# Patient Record
Sex: Female | Born: 1965 | Race: White | Hispanic: No | Marital: Married | Smoking: Never smoker
Health system: Southern US, Community
[De-identification: ages and names within clinical notes are randomized; demographics above are authoritative.]

## PROBLEM LIST (undated history)

## (undated) DIAGNOSIS — N183 Chronic kidney disease, stage 3 unspecified: Secondary | ICD-10-CM

## (undated) DIAGNOSIS — M1990 Unspecified osteoarthritis, unspecified site: Secondary | ICD-10-CM

## (undated) DIAGNOSIS — I10 Essential (primary) hypertension: Secondary | ICD-10-CM

## (undated) HISTORY — DX: Unspecified osteoarthritis, unspecified site: M19.90

## (undated) HISTORY — DX: Essential (primary) hypertension: I10

## (undated) HISTORY — DX: Chronic kidney disease, stage 3 unspecified: N18.30

---

## 2000-08-30 ENCOUNTER — Encounter: Admission: RE | Admit: 2000-08-30 | Discharge: 2000-08-30 | Payer: Self-pay | Admitting: Family Medicine

## 2000-08-30 ENCOUNTER — Encounter: Payer: Self-pay | Admitting: Family Medicine

## 2001-08-18 ENCOUNTER — Emergency Department (HOSPITAL_COMMUNITY): Admission: EM | Admit: 2001-08-18 | Discharge: 2001-08-19 | Payer: Self-pay | Admitting: Emergency Medicine

## 2001-08-19 ENCOUNTER — Encounter: Payer: Self-pay | Admitting: Emergency Medicine

## 2011-03-21 ENCOUNTER — Ambulatory Visit (INDEPENDENT_AMBULATORY_CARE_PROVIDER_SITE_OTHER): Payer: 59

## 2011-03-21 DIAGNOSIS — M5030 Other cervical disc degeneration, unspecified cervical region: Secondary | ICD-10-CM

## 2011-03-21 DIAGNOSIS — IMO0002 Reserved for concepts with insufficient information to code with codable children: Secondary | ICD-10-CM

## 2012-04-27 ENCOUNTER — Ambulatory Visit: Payer: 59

## 2012-04-27 ENCOUNTER — Ambulatory Visit (INDEPENDENT_AMBULATORY_CARE_PROVIDER_SITE_OTHER): Payer: 59 | Admitting: Internal Medicine

## 2012-04-27 VITALS — BP 130/82 | HR 69 | Temp 98.8°F | Resp 16 | Ht 66.0 in | Wt 172.0 lb

## 2012-04-27 DIAGNOSIS — M25571 Pain in right ankle and joints of right foot: Secondary | ICD-10-CM

## 2012-04-27 DIAGNOSIS — M25579 Pain in unspecified ankle and joints of unspecified foot: Secondary | ICD-10-CM

## 2012-04-27 DIAGNOSIS — S93401A Sprain of unspecified ligament of right ankle, initial encounter: Secondary | ICD-10-CM

## 2012-04-27 DIAGNOSIS — S93409A Sprain of unspecified ligament of unspecified ankle, initial encounter: Secondary | ICD-10-CM

## 2012-04-27 NOTE — Progress Notes (Signed)
  Subjective:    Patient ID: Tracy Mcgee, female    DOB: 02-Dec-1965, 47 y.o.   MRN: [id number]  HPIjumped off trailer yesterday and twisted right ankle which gave way Today has swelling and pain with weightbearing  No prior injury Work demands constant weightbearing/[facility]    Review of Systems     Objective:   Physical Exam Vital signs stable Right ankle with swelling over the lateral malleolus Tender ATF and calc fib Tender 3 cm from the distal fibula tip  Negative drawer Inversion nontender 1 legged stance impossible      [facility] reading (PRIMARY) by  Dr. Gael Londo=No fracture.   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

## 2012-05-02 ENCOUNTER — Ambulatory Visit (INDEPENDENT_AMBULATORY_CARE_PROVIDER_SITE_OTHER): Payer: 59 | Admitting: Internal Medicine

## 2012-05-02 VITALS — BP 120/82 | HR 71 | Temp 98.7°F | Resp 16 | Ht 66.0 in | Wt 172.0 lb

## 2012-05-02 DIAGNOSIS — S93409A Sprain of unspecified ligament of unspecified ankle, initial encounter: Secondary | ICD-10-CM

## 2012-05-02 DIAGNOSIS — S93401A Sprain of unspecified ligament of right ankle, initial encounter: Secondary | ICD-10-CM

## 2012-05-04 NOTE — Progress Notes (Signed)
Followup for ankle sprain Is improving/less swelling/gait is still with limp and can't manage Stairs   Exam- Minimal swelling of the lateral aspect of the ankle Tender ATF and CF No longer tender on the distal fibula Pain with inversion and full dorsiflexion Cannot manage single-leg stance   Problem #1 mild to moderate ankle sprain Continue brace At ankle exercises Followup one week to review readiness for work which requires being able to run

## 2012-05-09 ENCOUNTER — Ambulatory Visit (INDEPENDENT_AMBULATORY_CARE_PROVIDER_SITE_OTHER): Payer: 59 | Admitting: Family Medicine

## 2012-05-09 VITALS — BP 135/85 | HR 78 | Temp 98.6°F | Resp 16

## 2012-05-09 DIAGNOSIS — M25579 Pain in unspecified ankle and joints of unspecified foot: Secondary | ICD-10-CM

## 2012-05-09 DIAGNOSIS — S93409A Sprain of unspecified ligament of unspecified ankle, initial encounter: Secondary | ICD-10-CM

## 2012-05-09 NOTE — Patient Instructions (Signed)
Continue exercises, range of motion at home. Ibuprofen over the counter if needed.  sweedo brace or work boot for stability.  Recheck in 1 week - sooner if worse.

## 2012-05-09 NOTE — Progress Notes (Signed)
  Subjective:    Patient ID: Tracy Mcgee, female    DOB: [birth year], 47 y.o.   MRN: [id number]  HPI Tracy Mcgee is a 47 y.o. female Follow up R ankle sprain.  Prior notes reviewed,  - initial ov 04/27/12 - r ankle injury jumping off trailer few days prior. sweedo brace, rom, hep. Improving at last follow up.  Still some soreness walking down stairs. Rare shooting pain around side of ankle. Wearing brace inside shoe. Only taking brace off for HEP and few times per day. No swelling in past week.   Unable to fit brace under work boot - but both have great ankle support.   No prior R ankle sprain.   Works at [facility] - stairclimbing/walking involved but might be able to be in control room for awhile.   No ibuprofen needed in past week.   Review of Systems Pain in ankle, no further swelling, no ecchymosis.     Objective:   Physical Exam  Vitals reviewed. Constitutional: She is oriented to person, place, and time. She appears well-developed and well-nourished. No distress.  Pulmonary/Chest: Effort normal.  Musculoskeletal:       Feet:  Neurological: She is alert and oriented to person, place, and time. She has normal strength.       nvi distally. Full strength with resisted ankle testing.   Skin: Skin is warm and dry. No rash noted.  Psychiatric: She has a normal mood and affect. Her behavior is normal.    initial xr report reviewed - no fracture noted.     Assessment & Plan:  Tracy Mcgee is a 47 y.o. female 1. Ankle pain   2. Ankle sprain   R alteral ankle sprain with some high ankle signs, but no apparent swelling, and is improving. Can wear work boot or brace with activity, and will try full duty as may be able to have more seated work temporarily.  Advised on taking self out of work if activities that increase pain or feels unable to safely do required duties. Cont HEP, ibuprofen otc prn, recheck in 1 week - sooner if worse.  See letter.   Patient Instructions  Continue  exercises, range of motion at home. Ibuprofen over the counter if needed.  sweedo brace or work boot for stability.  Recheck in 1 week - sooner if worse.

## 2013-01-05 ENCOUNTER — Encounter: Payer: Self-pay | Admitting: Family Medicine

## 2013-01-05 ENCOUNTER — Ambulatory Visit (INDEPENDENT_AMBULATORY_CARE_PROVIDER_SITE_OTHER): Payer: 59 | Admitting: Family Medicine

## 2013-01-05 VITALS — BP 118/92 | HR 80 | Temp 98.9°F | Resp 14 | Ht 66.5 in | Wt 182.0 lb

## 2013-01-05 DIAGNOSIS — M545 Low back pain: Secondary | ICD-10-CM

## 2013-01-05 MED ORDER — CYCLOBENZAPRINE HCL 10 MG PO TABS: 10.0000 mg | ORAL_TABLET | Freq: Three times a day (TID) | ORAL | Status: DC | PRN

## 2013-01-05 MED ORDER — PREDNISONE 20 MG PO TABS: ORAL_TABLET | ORAL | Status: DC
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
## 2013-12-25 ENCOUNTER — Encounter: Payer: Self-pay | Admitting: Family Medicine

## 2013-12-25 ENCOUNTER — Ambulatory Visit (INDEPENDENT_AMBULATORY_CARE_PROVIDER_SITE_OTHER): Payer: BC Managed Care – PPO | Admitting: Family Medicine

## 2013-12-25 VITALS — BP 136/74 | HR 68 | Temp 98.4°F | Resp 16 | Ht 66.5 in | Wt 187.0 lb

## 2013-12-25 DIAGNOSIS — R5381 Other malaise: Secondary | ICD-10-CM

## 2013-12-25 DIAGNOSIS — R011 Cardiac murmur, unspecified: Secondary | ICD-10-CM

## 2013-12-25 DIAGNOSIS — R5383 Other fatigue: Principal | ICD-10-CM

## 2013-12-25 DIAGNOSIS — G4710 Hypersomnia, unspecified: Secondary | ICD-10-CM

## 2013-12-25 LAB — CBC WITH DIFFERENTIAL/PLATELET
BASOS ABS: 0 10*3/uL (ref 0.0–0.1)
BASOS PCT: 1 % (ref 0–1)
EOS PCT: 4 % (ref 0–5)
Eosinophils Absolute: 0.2 10*3/uL (ref 0.0–0.7)
HCT: 40 % (ref 36.0–46.0)
HEMOGLOBIN: 14 g/dL (ref 12.0–15.0)
Lymphocytes Relative: 48 % — ABNORMAL HIGH (ref 12–46)
Lymphs Abs: 1.8 10*3/uL (ref 0.7–4.0)
MCH: 30.6 pg (ref 26.0–34.0)
MCHC: 35 g/dL (ref 30.0–36.0)
MCV: 87.5 fL (ref 78.0–100.0)
Monocytes Absolute: 0.3 10*3/uL (ref 0.1–1.0)
Monocytes Relative: 9 % (ref 3–12)
Neutro Abs: 1.4 10*3/uL — ABNORMAL LOW (ref 1.7–7.7)
Neutrophils Relative %: 38 % — ABNORMAL LOW (ref 43–77)
Platelets: 247 10*3/uL (ref 150–400)
RBC: 4.57 MIL/uL (ref 3.87–5.11)
RDW: 13.1 % (ref 11.5–15.5)
WBC: 3.8 10*3/uL — ABNORMAL LOW (ref 4.0–10.5)

## 2013-12-25 LAB — COMPLETE METABOLIC PANEL WITH GFR
ALK PHOS: 35 U/L — AB (ref 39–117)
ALT: 13 U/L (ref 0–35)
AST: 15 U/L (ref 0–37)
Albumin: 4.4 g/dL (ref 3.5–5.2)
BUN: 12 mg/dL (ref 6–23)
CALCIUM: 9.6 mg/dL (ref 8.4–10.5)
CO2: 29 mEq/L (ref 19–32)
CREATININE: 1.02 mg/dL (ref 0.50–1.10)
Chloride: 103 mEq/L (ref 96–112)
GFR, Est African American: 75 mL/min
GFR, Est Non African American: 65 mL/min
Glucose, Bld: 80 mg/dL (ref 70–99)
Potassium: 4.1 mEq/L (ref 3.5–5.3)
Sodium: 140 mEq/L (ref 135–145)
Total Bilirubin: 0.4 mg/dL (ref 0.2–1.2)
Total Protein: 7.5 g/dL (ref 6.0–8.3)

## 2013-12-25 LAB — SEDIMENTATION RATE: Sed Rate: 9 mm/hr (ref 0–22)

## 2013-12-25 LAB — TSH: TSH: 2.9 u[IU]/mL (ref 0.350–4.500)

## 2013-12-25 LAB — IRON: IRON: 67 ug/dL (ref 42–145)

## 2013-12-25 LAB — VITAMIN B12: Vitamin B-12: 548 pg/mL (ref 211–911)

## 2013-12-25 MED ORDER — MOMETASONE FUROATE 0.1 % EX OINT: TOPICAL_OINTMENT | Freq: Every day | CUTANEOUS | Status: DC
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
## 2014-01-06 ENCOUNTER — Ambulatory Visit (HOSPITAL_COMMUNITY): Payer: BC Managed Care – PPO | Attending: Family Medicine | Admitting: Cardiology

## 2014-01-06 DIAGNOSIS — R5381 Other malaise: Secondary | ICD-10-CM

## 2014-01-06 DIAGNOSIS — R011 Cardiac murmur, unspecified: Secondary | ICD-10-CM

## 2014-01-06 DIAGNOSIS — R5383 Other fatigue: Secondary | ICD-10-CM

## 2014-01-06 NOTE — Progress Notes (Signed)
Echo performed. 

## 2016-11-27 ENCOUNTER — Encounter: Payer: Self-pay | Admitting: Family Medicine

## 2016-11-27 ENCOUNTER — Ambulatory Visit (INDEPENDENT_AMBULATORY_CARE_PROVIDER_SITE_OTHER): Payer: BLUE CROSS/BLUE SHIELD | Admitting: Family Medicine

## 2016-11-27 VITALS — BP 146/100 | HR 78 | Temp 98.8°F | Resp 16 | Ht 66.5 in | Wt 218.0 lb

## 2016-11-27 DIAGNOSIS — B349 Viral infection, unspecified: Secondary | ICD-10-CM | POA: Diagnosis not present

## 2016-11-27 DIAGNOSIS — R509 Fever, unspecified: Secondary | ICD-10-CM

## 2016-11-27 NOTE — Progress Notes (Signed)
Subjective:    Patient ID: Tracy Mcgee, female    DOB: Mar 03, 1966, 51 y.o.   MRN: [id number]  HPI Symptoms began last week. Symptoms consist of a dull headache, some neck stiffness, and general fatigue and malaise. Symptoms gradually improved over the course of last week. She did have some mild subjective fevers but these subsided and went away. Then over the weekend, the patient developed a widespread rash area rash is most prominent on both arms. It consists of numerous 2-3 mm erythematous papules coalescing into patches and plaques. They do not itch. They do not hurt. They do not burn. They are more pink than erythematous. They're extending onto the biceps and also onto the chest. She denies any rash on her legs or on her back. She denies any recent tick bite. Symptoms sound consistent with some type of viral syndrome. Past Medical History:  Diagnosis Date  . Arthritis    No past surgical history on file. Current Outpatient Prescriptions on File Prior to Visit  Medication Sig Dispense Refill  . fish oil-omega-3 fatty acids 1000 MG capsule Take 2 g by mouth daily.    Marland Kitchen ibuprofen (ADVIL,MOTRIN) 200 MG tablet Take 200 mg by mouth every 6 (six) hours as needed.    . Multiple Vitamins-Minerals (MULTIVITAMIN WITH MINERALS) tablet Take 1 tablet by mouth daily.     No current facility-administered medications on file prior to visit.    No Known Allergies Social History   Social History  . Marital status: Married    Spouse name: N/A  . Number of children: N/A  . Years of education: N/A   Occupational History  . Not on file.   Social History Main Topics  . Smoking status: Never Smoker  . Smokeless tobacco: Never Used  . Alcohol use No  . Drug use: No  . Sexual activity: Yes   Other Topics Concern  . Not on file   Social History Narrative  . No narrative on file      Review of Systems  All other systems reviewed and are negative.      Objective:   Physical Exam    Constitutional: She appears well-developed and well-nourished. No distress.  HENT:  Head: Normocephalic and atraumatic.  Right Ear: External ear normal.  Left Ear: External ear normal.  Nose: Nose normal.  Mouth/Throat: Oropharynx is clear and moist. No oropharyngeal exudate.  Eyes: Pupils are equal, round, and reactive to light. Conjunctivae and EOM are normal. Right eye exhibits no discharge. Left eye exhibits no discharge. No scleral icterus.  Neck: Normal range of motion. Neck supple.  Cardiovascular: Normal rate, regular rhythm and normal heart sounds.  Exam reveals no gallop and no friction rub.   No murmur heard. Pulmonary/Chest: Effort normal and breath sounds normal. No respiratory distress. She has no wheezes. She has no rales.  Abdominal: Soft. Bowel sounds are normal. She exhibits no distension. There is no tenderness. There is no rebound and no guarding.  Lymphadenopathy:    She has no cervical adenopathy.  Skin: Rash noted. She is not diaphoretic. There is erythema.  Vitals reviewed.         Assessment & Plan:  Fever, unspecified fever cause - Plan: CBC with Differential/Platelet, COMPLETE METABOLIC PANEL WITH GFR, B. burgdorfi antibodies by WB, [place] spotted fvr abs pnl(IgG+IgM)  Viral syndrome  I believe the patient is having a viral syndrome. Clinically she is gradually improving. Fevers have gone away. Fatigue is better. Neck stiffness  and headaches are better. However she is concerned by the rash. She would like labs drawn to check tick titers. Rash is not consistent with Lyme disease. Clinically she is improving so I believe [facility] spotted fever is unlikely. I believe this is more likely viral. I'm happy to oblige the patient and check the labs. I would also check a CBC as well as a CMP. If labs are normal, I would monitor the patient and I expect self limited resolution of the rash over the next 3-4 days. Certainly if symptoms change or worsen I want her  to come back immediately

## 2016-11-28 LAB — COMPLETE METABOLIC PANEL WITH GFR
ALT: 14 U/L (ref 6–29)
AST: 13 U/L (ref 10–35)
Albumin: 3.8 g/dL (ref 3.6–5.1)
Alkaline Phosphatase: 53 U/L (ref 33–130)
BUN: 8 mg/dL (ref 7–25)
CALCIUM: 9.5 mg/dL (ref 8.6–10.4)
CHLORIDE: 100 mmol/L (ref 98–110)
CO2: 26 mmol/L (ref 20–32)
Creat: 0.96 mg/dL (ref 0.50–1.05)
GFR, EST AFRICAN AMERICAN: 79 mL/min (ref >=60)
GFR, EST NON AFRICAN AMERICAN: 69 mL/min (ref >=60)
Glucose, Bld: 94 mg/dL (ref 70–99)
Potassium: 4.2 mmol/L (ref 3.5–5.3)
Sodium: 138 mmol/L (ref 135–146)
Total Bilirubin: 0.3 mg/dL (ref 0.2–1.2)
Total Protein: 7.4 g/dL (ref 6.1–8.1)

## 2016-11-28 LAB — CBC WITH DIFFERENTIAL/PLATELET
BASOS ABS: 62 {cells}/uL (ref 0–200)
Basophils Relative: 1 %
EOS ABS: 372 {cells}/uL (ref 15–500)
Eosinophils Relative: 6 %
HEMATOCRIT: 37.6 % (ref 35.0–45.0)
Hemoglobin: 12.8 g/dL (ref 12.0–15.0)
LYMPHS PCT: 31 %
Lymphs Abs: 1922 cells/uL (ref 850–3900)
MCH: 29.6 pg (ref 27.0–33.0)
MCHC: 34 g/dL (ref 32.0–36.0)
MCV: 87 fL (ref 80.0–100.0)
MPV: 12.4 fL (ref 7.5–12.5)
Monocytes Absolute: 682 cells/uL (ref 200–950)
Monocytes Relative: 11 %
Neutro Abs: 3162 cells/uL (ref 1500–7800)
Neutrophils Relative %: 51 %
PLATELETS: 345 10*3/uL (ref 140–400)
RBC: 4.32 MIL/uL (ref 3.80–5.10)
RDW: 12.8 % (ref 11.0–15.0)
WBC: 6.2 10*3/uL (ref 3.8–10.8)

## 2016-11-28 LAB — LYME ABY, WSTRN BLT IGG & IGM W/BANDS
B BURGDORFERI IGG ABS (IB): NEGATIVE
B burgdorferi IgM Abs (IB): NEGATIVE
LYME DISEASE 23 KD IGG: NONREACTIVE
LYME DISEASE 28 KD IGG: NONREACTIVE
LYME DISEASE 30 KD IGG: NONREACTIVE
LYME DISEASE 39 KD IGM: NONREACTIVE
LYME DISEASE 41 KD IGG: NONREACTIVE
LYME DISEASE 66 KD IGG: NONREACTIVE
Lyme Disease 18 kD IgG: NONREACTIVE
Lyme Disease 23 kD IgM: NONREACTIVE
Lyme Disease 39 kD IgG: NONREACTIVE
Lyme Disease 41 kD IgM: NONREACTIVE
Lyme Disease 45 kD IgG: NONREACTIVE
Lyme Disease 58 kD IgG: NONREACTIVE
Lyme Disease 93 kD IgG: NONREACTIVE

## 2016-11-28 LAB — ROCKY MTN SPOTTED FVR ABS PNL(IGG+IGM)
RMSF IgG: NOT DETECTED
RMSF IgM: NOT DETECTED

## 2018-03-15 DIAGNOSIS — J069 Acute upper respiratory infection, unspecified: Secondary | ICD-10-CM | POA: Diagnosis not present

## 2018-03-15 DIAGNOSIS — H6692 Otitis media, unspecified, left ear: Secondary | ICD-10-CM | POA: Diagnosis not present

## 2020-01-19 DIAGNOSIS — H40003 Preglaucoma, unspecified, bilateral: Secondary | ICD-10-CM | POA: Diagnosis not present

## 2020-10-25 DIAGNOSIS — Z6838 Body mass index (BMI) 38.0-38.9, adult: Secondary | ICD-10-CM | POA: Diagnosis not present

## 2020-10-25 DIAGNOSIS — N951 Menopausal and female climacteric states: Secondary | ICD-10-CM | POA: Diagnosis not present

## 2021-08-28 ENCOUNTER — Ambulatory Visit (INDEPENDENT_AMBULATORY_CARE_PROVIDER_SITE_OTHER): Payer: BC Managed Care – PPO | Admitting: Family Medicine

## 2021-08-28 ENCOUNTER — Ambulatory Visit (HOSPITAL_COMMUNITY)
Admission: RE | Admit: 2021-08-28 | Discharge: 2021-08-28 | Disposition: A | Discharge status: Home / Self Care | Payer: BC Managed Care – PPO | Source: Ambulatory Visit | Attending: Family Medicine | Admitting: Family Medicine

## 2021-08-28 VITALS — BP 160/90 | HR 93 | Temp 98.7°F | Ht 66.0 in | Wt 231.6 lb

## 2021-08-28 DIAGNOSIS — I10 Essential (primary) hypertension: Secondary | ICD-10-CM | POA: Diagnosis not present

## 2021-08-28 DIAGNOSIS — R030 Elevated blood-pressure reading, without diagnosis of hypertension: Secondary | ICD-10-CM

## 2021-08-28 DIAGNOSIS — M545 Low back pain, unspecified: Secondary | ICD-10-CM | POA: Insufficient documentation

## 2021-08-28 NOTE — Progress Notes (Signed)
Subjective:    Patient ID: Tracy Mcgee, female    DOB: [birth year], 56 y.o.   MRN: [id number]  HPI Patient has been dealing with low back pain for years.  She has been seeing a chiropractor but now the pain is beyond their ability to help the.  She is dealing with constant low back pain in a bandlike fashion roughly around the level of L5.  She occasionally gets numbness in her thighs but she denies any sciatica-like pain.  She denies any weakness in the legs.  She denies any bowel or bladder incontinence.  She denies any recent falls or injuries.  Her BMI is elevated 37.  Her blood pressure is extremely high at 160/90 but she states that this is whitecoat syndrome although I have no numbers to compare to in the last 4 years. Past Medical History:  Diagnosis Date   Arthritis    No past surgical history on file. Current Outpatient Medications on File Prior to Visit  Medication Sig Dispense Refill   fish oil-omega-3 fatty acids 1000 MG capsule Take 2 g by mouth daily.     ibuprofen (ADVIL,MOTRIN) 200 MG tablet Take 200 mg by mouth every 6 (six) hours as needed.     Multiple Vitamins-Minerals (MULTIVITAMIN WITH MINERALS) tablet Take 1 tablet by mouth daily.     No current facility-administered medications on file prior to visit.   No Known Allergies Social History   Socioeconomic History   Marital status: Married    Spouse name: Not on file   Number of children: Not on file   Years of education: Not on file   Highest education level: Not on file  Occupational History   Not on file  Tobacco Use   Smoking status: Never   Smokeless tobacco: Never  Substance and Sexual Activity   Alcohol use: No   Drug use: No   Sexual activity: Yes  Other Topics Concern   Not on file  Social History Narrative   Not on file   Social Determinants of Health   Financial Resource Strain: Not on file  Food Insecurity: Not on file  Transportation Needs: Not on file  Physical Activity: Not on  file  Stress: Not on file  Social Connections: Not on file  Intimate Partner Violence: Not on file      Review of Systems  All other systems reviewed and are negative.     Objective:   Physical Exam Vitals reviewed.  Constitutional:      General: She is not in acute distress.    Appearance: She is well-developed. She is not diaphoretic.  HENT:     Head: Normocephalic and atraumatic.  Cardiovascular:     Rate and Rhythm: Normal rate and regular rhythm.     Heart sounds: Normal heart sounds. No murmur heard.   No friction rub. No gallop.  Pulmonary:     Effort: Pulmonary effort is normal. No respiratory distress.     Breath sounds: Normal breath sounds. No wheezing or rales.  Musculoskeletal:     Lumbar back: Spasms and tenderness present. No bony tenderness. Decreased range of motion. Negative right straight leg raise test and negative left straight leg raise test.       Back:          Assessment & Plan:  Low back pain at multiple sites - Plan: DG Lumbar Spine Complete  Elevated blood pressure reading - Plan: CBC with Differential/Platelet, Lipid panel, COMPLETE METABOLIC PANEL WITH  GFR I suspect the patient has degenerative disc disease and is having compensatory muscle spasms.  I would recommend starting meloxicam 15 mg a day and using muscle relaxers as needed and focus on weight loss to try to help decrease the progression of the disease.  However first I like to get an x-ray to evaluate further.  Also asked the patient to check her blood pressure at home several times over the next few days and report values to me to prove that this blood pressure is fictitious.  I explained to the patient that NSAIDs can increase blood pressure so monitor her blood pressure is well controlled before we start a daily NSAID such as meloxicam.  If the blood pressures are controlled I will start meloxicam 15 mg a day.  I will also consider starting Ozempic or Wegovy to help facilitate weight  loss.  Obtain fasting lab work.

## 2021-09-01 ENCOUNTER — Other Ambulatory Visit: Payer: Self-pay

## 2021-09-01 DIAGNOSIS — M545 Low back pain, unspecified: Secondary | ICD-10-CM

## 2021-09-01 LAB — CBC WITH DIFFERENTIAL/PLATELET
Absolute Monocytes: 546 cells/uL (ref 200–950)
Basophils Absolute: 62 cells/uL (ref 0–200)
Basophils Relative: 1 %
Eosinophils Absolute: 223 cells/uL (ref 15–500)
Eosinophils Relative: 3.6 %
HCT: 41.9 % (ref 35.0–45.0)
Hemoglobin: 14.2 g/dL (ref 11.7–15.5)
Lymphs Abs: 2201 cells/uL (ref 850–3900)
MCH: 30 pg (ref 27.0–33.0)
MCHC: 33.9 g/dL (ref 32.0–36.0)
MCV: 88.6 fL (ref 80.0–100.0)
MPV: 11.4 fL (ref 7.5–12.5)
Monocytes Relative: 8.8 %
Neutro Abs: 3168 cells/uL (ref 1500–7800)
Neutrophils Relative %: 51.1 %
Platelets: 314 10*3/uL (ref 140–400)
RBC: 4.73 10*6/uL (ref 3.80–5.10)
RDW: 12.6 % (ref 11.0–15.0)
Total Lymphocyte: 35.5 %
WBC: 6.2 10*3/uL (ref 3.8–10.8)

## 2021-09-01 LAB — COMPLETE METABOLIC PANEL WITH GFR
AG Ratio: 1.3 (calc) (ref 1.0–2.5)
ALT: 13 U/L (ref 6–29)
AST: 14 U/L (ref 10–35)
Albumin: 4.3 g/dL (ref 3.6–5.1)
Alkaline phosphatase (APISO): 45 U/L (ref 37–153)
BUN/Creatinine Ratio: 15 (calc) (ref 6–22)
BUN: 16 mg/dL (ref 7–25)
CO2: 29 mmol/L (ref 20–32)
Calcium: 9.5 mg/dL (ref 8.6–10.4)
Chloride: 103 mmol/L (ref 98–110)
Creat: 1.1 mg/dL — ABNORMAL HIGH (ref 0.50–1.03)
Globulin: 3.4 g/dL (calc) (ref 1.9–3.7)
Glucose, Bld: 135 mg/dL — ABNORMAL HIGH (ref 65–99)
Potassium: 4 mmol/L (ref 3.5–5.3)
Sodium: 140 mmol/L (ref 135–146)
Total Bilirubin: 0.4 mg/dL (ref 0.2–1.2)
Total Protein: 7.7 g/dL (ref 6.1–8.1)
eGFR: 59 mL/min/{1.73_m2} — ABNORMAL LOW (ref >=60)

## 2021-09-01 LAB — LIPID PANEL
Cholesterol: 195 mg/dL (ref <200)
HDL: 54 mg/dL (ref >=50)
LDL Cholesterol (Calc): 114 mg/dL (calc) — ABNORMAL HIGH
Non-HDL Cholesterol (Calc): 141 mg/dL (calc) — ABNORMAL HIGH (ref <130)
Total CHOL/HDL Ratio: 3.6 (calc) (ref <5.0)
Triglycerides: 159 mg/dL — ABNORMAL HIGH (ref <150)

## 2021-09-01 LAB — TEST AUTHORIZATION 2

## 2021-09-01 LAB — HEMOGLOBIN A1C W/OUT EAG: Hgb A1c MFr Bld: 5.4 % of total Hgb (ref <5.7)

## 2021-09-01 MED ORDER — MELOXICAM 15 MG PO TABS: 15.0000 mg | ORAL_TABLET | Freq: Every day | ORAL | 0 refills | Status: DC

## 2021-09-07 ENCOUNTER — Ambulatory Visit: Payer: BC Managed Care – PPO | Admitting: Family Medicine

## 2021-09-28 ENCOUNTER — Ambulatory Visit: Payer: BC Managed Care – PPO | Admitting: Family Medicine

## 2021-09-28 ENCOUNTER — Other Ambulatory Visit: Payer: Self-pay | Admitting: Family Medicine

## 2021-09-28 ENCOUNTER — Encounter: Payer: Self-pay | Admitting: Family Medicine

## 2021-09-28 VITALS — BP 130/102 | HR 102 | Temp 99.1°F | Ht 66.0 in | Wt 248.0 lb

## 2021-09-28 DIAGNOSIS — M545 Low back pain, unspecified: Secondary | ICD-10-CM

## 2021-09-28 DIAGNOSIS — N1831 Chronic kidney disease, stage 3a: Secondary | ICD-10-CM

## 2021-09-28 MED ORDER — VALSARTAN 80 MG PO TABS: 80.0000 mg | ORAL_TABLET | Freq: Every day | ORAL | 3 refills | Status: DC

## 2021-09-28 NOTE — Progress Notes (Signed)
Subjective:    Patient ID: Tracy Mcgee, female    DOB: [birth year], 56 y.o.   MRN: [id number]  HPI 08/28/21 Patient has been dealing with low back pain for years.  She has been seeing a chiropractor but now the pain is beyond their ability to help the.  She is dealing with constant low back pain in a bandlike fashion roughly around the level of L5.  She occasionally gets numbness in her thighs but she denies any sciatica-like pain.  She denies any weakness in the legs.  She denies any bowel or bladder incontinence.  She denies any recent falls or injuries.  Her BMI is elevated 37.  Her blood pressure is extremely high at 160/90 but she states that this is whitecoat syndrome although I have no numbers to compare to in the last 4 years.  At that time, my plan was: I suspect the patient has degenerative disc disease and is having compensatory muscle spasms.  I would recommend starting meloxicam 15 mg a day and using muscle relaxers as needed and focus on weight loss to try to help decrease the progression of the disease.  However first I like to get an x-ray to evaluate further.  Also asked the patient to check her blood pressure at home several times over the next few days and report values to me to prove that this blood pressure is fictitious.  I explained to the patient that NSAIDs can increase blood pressure so monitor her blood pressure is well controlled before we start a daily NSAID such as meloxicam.  If the blood pressures are controlled I will start meloxicam 15 mg a day.  I will also consider starting Ozempic or Wegovy to help facilitate weight loss.  Obtain fasting lab work.  09/28/21 Diffuse degenerative change. Disc degeneration most prominent at L5-S1. No acute bony abnormality. Lab work showed a creatinine of 1.1 with a GFR of 59 suggesting stage IIIa chronic kidney disease.  Patient's been checking her blood pressure and finding her systolic blood pressure to be averaging between 130 and  140 with diastolic blood pressures between 70 and 80.  She has been taking meloxicam daily to every other day and seeing significant benefit in her back pain although it did cause some swelling in her ankles. Past Medical History:  Diagnosis Date   Arthritis    No past surgical history on file. Current Outpatient Medications on File Prior to Visit  Medication Sig Dispense Refill   ibuprofen (ADVIL,MOTRIN) 200 MG tablet Take 200 mg by mouth every 6 (six) hours as needed.     Multiple Vitamins-Minerals (MULTIVITAMIN WITH MINERALS) tablet Take 1 tablet by mouth daily.     No current facility-administered medications on file prior to visit.   No Known Allergies Social History   Socioeconomic History   Marital status: Married    Spouse name: Not on file   Number of children: Not on file   Years of education: Not on file   Highest education level: Not on file  Occupational History   Not on file  Tobacco Use   Smoking status: Never   Smokeless tobacco: Never  Substance and Sexual Activity   Alcohol use: No   Drug use: No   Sexual activity: Yes  Other Topics Concern   Not on file  Social History Narrative   Not on file   Social Determinants of Health   Financial Resource Strain: Not on file  Food Insecurity: Not on file  Transportation Needs: Not on file  Physical Activity: Not on file  Stress: Not on file  Social Connections: Not on file  Intimate Partner Violence: Not on file      Review of Systems  All other systems reviewed and are negative.      Objective:   Physical Exam Vitals reviewed.  Constitutional:      General: She is not in acute distress.    Appearance: She is well-developed. She is not diaphoretic.  HENT:     Head: Normocephalic and atraumatic.  Cardiovascular:     Rate and Rhythm: Normal rate and regular rhythm.     Heart sounds: Normal heart sounds. No murmur heard.    No friction rub. No gallop.  Pulmonary:     Effort: Pulmonary effort is  normal. No respiratory distress.     Breath sounds: Normal breath sounds. No wheezing or rales.  Musculoskeletal:     Lumbar back: Spasms and tenderness present. No bony tenderness. Decreased range of motion. Negative right straight leg raise test and negative left straight leg raise test.       Back:           Assessment & Plan:  Stage 3a chronic kidney disease (HCC) Spent the entire visit today discussing her kidneys.  Recommended starting valsartan 80 mg daily for hypertension control trying to keep her blood pressure less than 130/80 as well as for renal protection.  Discussed her cholesterol which is mildly elevated and recommended diet exercise and weight loss to address.  Recommended avoiding all NSAIDs including meloxicam.  Recommended drinking plenty of water.  We discussed URKYHC for possible weight loss and she will check on the price.

## 2021-09-28 NOTE — Telephone Encounter (Signed)
Requested Prescriptions  Pending Prescriptions Disp Refills  . meloxicam (MOBIC) 15 MG tablet [Pharmacy Med Name: MELOXICAM 15 MG TABLET] 30 tablet 0    Sig: TAKE 1 TABLET (15 MG TOTAL) BY MOUTH DAILY.     Analgesics:  COX2 Inhibitors Failed - 09/28/2021  9:54 AM      Failed - Manual Review: Labs are only required if the patient has taken medication for more than 8 weeks.      Failed - Cr in normal range and within 360 days    Creat  Date Value Ref Range Status  08/28/2021 1.10 (H) 0.50 - 1.03 mg/dL Final         Passed - HGB in normal range and within 360 days    Hemoglobin  Date Value Ref Range Status  08/28/2021 14.2 11.7 - 15.5 g/dL Final         Passed - HCT in normal range and within 360 days    HCT  Date Value Ref Range Status  08/28/2021 41.9 35.0 - 45.0 % Final         Passed - AST in normal range and within 360 days    AST  Date Value Ref Range Status  08/28/2021 14 10 - 35 U/L Final         Passed - ALT in normal range and within 360 days    ALT  Date Value Ref Range Status  08/28/2021 13 6 - 29 U/L Final         Passed - eGFR is 30 or above and within 360 days    GFR, Est African American  Date Value Ref Range Status  11/27/2016 79 >=60 mL/min Final   GFR, Est Non African American  Date Value Ref Range Status  11/27/2016 69 >=60 mL/min Final   eGFR  Date Value Ref Range Status  08/28/2021 59 (L) > OR = 60 mL/min/1.20m Final    Comment:    The eGFR is based on the CKD-EPI 2021 equation. To calculate  the new eGFR from a previous Creatinine or Cystatin C result, go to https://www.kidney.org/professionals/ kdoqi/gfr%5Fcalculator          Passed - Patient is not pregnant      Passed - Valid encounter within last 12 months    Recent Outpatient Visits          1 month ago Low back pain at multiple sites   [facility]ie Mcgee MD   4 years ago Fever, unspecified fever cause   [facility]  WCammie Mcgee MD   7 years ago Other malaise and fatigue   [facility] Mcgee MD   8 years ago Low back pain   [facility] Warren T, MD   9 years ago Ankle pain   [facility]      Future Appointments            Today Pickard, WCammie Mcgee MD [place]

## 2021-09-30 ENCOUNTER — Encounter: Payer: Self-pay | Admitting: Family Medicine

## 2021-10-03 NOTE — Telephone Encounter (Signed)
Spoke with pt and told pt that if she wants an actural x-ray (film) she will have to go to radiology and get a copy from there. We don't have the x-ray.   Pt voiced understanding.

## 2021-10-06 ENCOUNTER — Other Ambulatory Visit: Payer: Self-pay

## 2021-10-06 NOTE — Telephone Encounter (Signed)
Pharmacy faxed a refill request for valsartan (DIOVAN) 80 MG tablet [[id number]]    Order Details Dose: 80 mg Route: Oral Frequency: Daily  Dispense Quantity: 90 tablet Refills: 3        Sig: Take 1 tablet (80 mg total) by mouth daily.       Start Date: 09/28/21 End Date: --  Written Date: 09/28/21 Expiration Date: 09/28/22

## 2021-10-06 NOTE — Telephone Encounter (Signed)
Refilled at :[id number] [place], [place] - [address]  Receipt confirmed 09/28/2021 #[id number] refills. Requested Prescriptions  Pending Prescriptions Disp Refills  . valsartan (DIOVAN) 80 MG tablet 90 tablet 3    Sig: Take 1 tablet (80 mg total) by mouth daily.     Cardiovascular:  Angiotensin Receptor Blockers Failed - 10/06/2021 12:02 PM      Failed - Cr in normal range and within 180 days    Creat  Date Value Ref Range Status  08/28/2021 1.10 (H) 0.50 - 1.03 mg/dL Final         Failed - Last BP in normal range    BP Readings from Last 1 Encounters:  09/28/21 (!) 130/102         Passed - K in normal range and within 180 days    Potassium  Date Value Ref Range Status  08/28/2021 4.0 3.5 - 5.3 mmol/L Final         Passed - Patient is not pregnant      Passed - Valid encounter within last 6 months    Recent Outpatient Visits          1 month ago Low back pain at multiple sites   [facility] Pickard, Priscille Heidelberg, MD   4 years ago Fever, unspecified fever cause   [facility] Tanya Nones Priscille Heidelberg, MD   7 years ago Other malaise and fatigue   [facility] Tanya Nones, Priscille Heidelberg, MD   8 years ago Low back pain   [facility] Pickard, Priscille Heidelberg, MD   9 years ago Ankle pain   [facility], Asencion Partridge, MD

## 2021-10-11 ENCOUNTER — Other Ambulatory Visit: Payer: Self-pay | Admitting: Family Medicine

## 2021-10-11 MED ORDER — VALSARTAN 80 MG PO TABS: 80.0000 mg | ORAL_TABLET | Freq: Every day | ORAL | 3 refills | Status: DC

## 2021-10-11 NOTE — Telephone Encounter (Signed)
Refilled as provider refilled to a different pharmacy.   Requested Prescriptions  Pending Prescriptions Disp Refills  . valsartan (DIOVAN) 80 MG tablet 90 tablet 3    Sig: Take 1 tablet (80 mg total) by mouth daily.     Cardiovascular:  Angiotensin Receptor Blockers Failed - 10/11/2021 11:57 AM      Failed - Cr in normal range and within 180 days    Creat  Date Value Ref Range Status  08/28/2021 1.10 (H) 0.50 - 1.03 mg/dL Final         Failed - Last BP in normal range    BP Readings from Last 1 Encounters:  09/28/21 (!) 130/102         Passed - K in normal range and within 180 days    Potassium  Date Value Ref Range Status  08/28/2021 4.0 3.5 - 5.3 mmol/L Final         Passed - Patient is not pregnant      Passed - Valid encounter within last 6 months    Recent Outpatient Visits          1 month ago Low back pain at multiple sites   [facility] Pickard, Priscille Heidelberg, MD   4 years ago Fever, unspecified fever cause   [facility] Tanya Nones Priscille Heidelberg, MD   7 years ago Other malaise and fatigue   [facility] Tanya Nones, Priscille Heidelberg, MD   8 years ago Low back pain   [facility] Pickard, Priscille Heidelberg, MD   9 years ago Ankle pain   [facility], Asencion Partridge, MD

## 2021-10-11 NOTE — Telephone Encounter (Signed)
Received a fax from [facility] requesting a refill on valsartan (DIOVAN) 80 MG tablet [[id number]]    Order Details Dose: 80 mg Route: Oral Frequency: Daily  Dispense Quantity: 90 tablet Refills: 3        Sig: Take 1 tablet (80 mg total) by mouth daily.       Start Date: 09/28/21 End Date: --  Written Date: 09/28/21 Expiration Date: 09/28/22  Providers  Authorizing Provider:   Donita Brooks, MD  [address] [place], [place] [address]  Phone:  [phone]   Fax:  [phone]  DEA #:  [id number]   NPI:  [id number]

## 2022-04-27 ENCOUNTER — Other Ambulatory Visit: Payer: Self-pay | Admitting: Family Medicine

## 2022-04-27 DIAGNOSIS — Z1231 Encounter for screening mammogram for malignant neoplasm of breast: Secondary | ICD-10-CM

## 2022-05-03 ENCOUNTER — Ambulatory Visit: Payer: BC Managed Care – PPO | Admitting: Family Medicine

## 2022-05-03 VITALS — BP 122/92 | HR 93 | Temp 99.0°F | Ht 66.0 in | Wt 244.0 lb

## 2022-05-03 DIAGNOSIS — M25562 Pain in left knee: Secondary | ICD-10-CM | POA: Diagnosis not present

## 2022-05-03 NOTE — Assessment & Plan Note (Signed)
Patients pain is not reproducible on exam, no signs of acute injury. She declines xray to evaluate for arthritis and is not interested in knee injections at this time. Offered physical therapy referral. Encouraged her to wear a brace and continue ice and elevation. May continue Tylenol and low dose short term voltaren gel. Return to office if symptoms persist or worsen.

## 2022-05-03 NOTE — Progress Notes (Signed)
   Acute Office Visit  Subjective:     Patient ID: Tracy Mcgee, female    DOB: 1965/09/24, 57 y.o.   MRN: [id number]  Chief Complaint  Patient presents with   Acute Visit    left knee real stiff and some sharp pain    HPI Patient is in today for left knee stiffness for 2 weeks. Her ROM is limited with flexion and extension and is worse as day goes on. She also has sharp pain around her patella. Denies trauma, injury, pain to touch, swelling, redness, calf pain. Has tried rest, ice elevation, and tylenol.  Review of Systems  All other systems reviewed and are negative.   Past Medical History:  Diagnosis Date   Arthritis    CKD (chronic kidney disease) stage 3, GFR 30-59 ml/min (HCC)    Hypertension    No past surgical history on file. Current Outpatient Medications on File Prior to Visit  Medication Sig Dispense Refill   Multiple Vitamins-Minerals (MULTIVITAMIN WITH MINERALS) tablet Take 1 tablet by mouth daily.     valsartan (DIOVAN) 80 MG tablet Take 1 tablet (80 mg total) by mouth daily. 90 tablet 3   No current facility-administered medications on file prior to visit.   No Known Allergies     Objective:    BP (!) 122/92   Pulse 93   Temp 99 F (37.2 C) (Oral)   Ht 5\' 6"  (1.676 m)   Wt 244 lb (110.7 kg)   SpO2 97%   BMI 39.38 kg/m    Physical Exam Vitals and nursing note reviewed.  Constitutional:      Appearance: Normal appearance. She is normal weight.  HENT:     Head: Normocephalic and atraumatic.  Musculoskeletal:     Left knee: No swelling, deformity, erythema, ecchymosis, bony tenderness or crepitus. Normal range of motion. No tenderness. Normal alignment, normal meniscus and normal patellar mobility. Normal pulse.     Instability Tests: Anterior drawer test negative. Posterior drawer test negative.  Skin:    General: Skin is warm and dry.  Neurological:     General: No focal deficit present.     Mental Status: She is alert and oriented to  person, place, and time. Mental status is at baseline.  Psychiatric:        Mood and Affect: Mood normal.        Behavior: Behavior normal.        Thought Content: Thought content normal.        Judgment: Judgment normal.     No results found for any visits on 05/03/22.      Assessment & Plan:   Problem List Items Addressed This Visit       Other   Acute pain of left knee - Primary    Patients pain is not reproducible on exam, no signs of acute injury. She declines xray to evaluate for arthritis and is not interested in knee injections at this time. Offered physical therapy referral. Encouraged her to wear a brace and continue ice and elevation. May continue Tylenol and low dose short term voltaren gel. Return to office if symptoms persist or worsen.       No orders of the defined types were placed in this encounter.   Return if symptoms worsen or fail to improve.  Rubie Maid, FNP

## 2022-05-22 ENCOUNTER — Ambulatory Visit: Payer: BC Managed Care – PPO | Admitting: Family Medicine

## 2022-05-22 ENCOUNTER — Ambulatory Visit (HOSPITAL_COMMUNITY)
Admission: RE | Admit: 2022-05-22 | Discharge: 2022-05-22 | Disposition: A | Discharge status: Home / Self Care | Payer: BC Managed Care – PPO | Source: Ambulatory Visit | Attending: Family Medicine | Admitting: Family Medicine

## 2022-05-22 VITALS — BP 132/84 | HR 86 | Temp 98.6°F | Ht 66.0 in | Wt 248.0 lb

## 2022-05-22 DIAGNOSIS — M25762 Osteophyte, left knee: Secondary | ICD-10-CM | POA: Diagnosis not present

## 2022-05-22 DIAGNOSIS — M25562 Pain in left knee: Secondary | ICD-10-CM | POA: Diagnosis not present

## 2022-05-22 NOTE — Progress Notes (Signed)
Subjective:    Patient ID: Tracy Mcgee, female    DOB: [birth year], 57 y.o.   MRN: [id number]  Knee Pain   Patient is a very pleasant 57 year old Caucasian female who presents today with a 1 month history of left knee pain.  The pain is located primarily in the posterior aspect of the knee.  She reports that she flexes her knee she will feel tightness in the back of her knee.  It aches and blocks.  If she rest he does not pain however when she stands she develops a stabbing pain within the knee joint.  It hurts to walk for a few feet.  She denies any instability or laxity in the knee joint.  Today on exam there is no laxity to varus or valgus stress.  She has a negative anterior and posterior drawer sign.  There is no erythema or effusion however she does have pain with Apley grind.  She also can stiffness in the anterior part of her knee.  She denies any falls or injuries.  She is unable to take NSAIDs due to chronic kidney disease  Past Medical History:  Diagnosis Date   Arthritis    CKD (chronic kidney disease) stage 3, GFR 30-59 ml/min (HCC)    Hypertension    No past surgical history on file. Current Outpatient Medications on File Prior to Visit  Medication Sig Dispense Refill   Multiple Vitamins-Minerals (MULTIVITAMIN WITH MINERALS) tablet Take 1 tablet by mouth daily.     valsartan (DIOVAN) 80 MG tablet Take 1 tablet (80 mg total) by mouth daily. 90 tablet 3   No current facility-administered medications on file prior to visit.   No Known Allergies Social History   Socioeconomic History   Marital status: Married    Spouse name: Not on file   Number of children: Not on file   Years of education: Not on file   Highest education level: Not on file  Occupational History   Not on file  Tobacco Use   Smoking status: Never   Smokeless tobacco: Never  Substance and Sexual Activity   Alcohol use: No   Drug use: No   Sexual activity: Yes  Other Topics Concern   Not on file   Social History Narrative   Not on file   Social Determinants of Health   Financial Resource Strain: Not on file  Food Insecurity: Not on file  Transportation Needs: Not on file  Physical Activity: Not on file  Stress: Not on file  Social Connections: Not on file  Intimate Partner Violence: Not on file      Review of Systems  All other systems reviewed and are negative.      Objective:   Physical Exam Vitals reviewed.  Constitutional:      General: She is not in acute distress.    Appearance: She is well-developed. She is not diaphoretic.  HENT:     Head: Normocephalic and atraumatic.  Cardiovascular:     Rate and Rhythm: Normal rate and regular rhythm.     Heart sounds: Normal heart sounds. No murmur heard.    No friction rub. No gallop.  Pulmonary:     Effort: Pulmonary effort is normal. No respiratory distress.     Breath sounds: Normal breath sounds. No wheezing or rales.  Musculoskeletal:     Left knee: No swelling, deformity or effusion. Decreased range of motion. Tenderness present. No medial joint line, lateral joint line, MCL, LCL, ACL or  PCL tenderness. Abnormal meniscus.           Assessment & Plan:  Acute pain of left knee - Plan: DG Knee Complete 4 Views Left Not certain of the diagnosis.  Begin by obtaining an x-ray of the knee.  Certainly could be osteoarthritis in the posterior lateral portion of the knee.  However based on her exam I am also concerned about a lateral meniscal tear.  We discussed a cortisone injection and she would like to proceed with x-ray first.  She will also try Voltaren gel 4 times daily

## 2022-05-28 ENCOUNTER — Ambulatory Visit: Payer: BC Managed Care – PPO | Admitting: Family Medicine

## 2022-05-29 ENCOUNTER — Ambulatory Visit: Payer: BC Managed Care – PPO | Admitting: Family Medicine

## 2022-06-07 ENCOUNTER — Ambulatory Visit: Payer: BC Managed Care – PPO | Admitting: Family Medicine

## 2022-06-07 DIAGNOSIS — M25562 Pain in left knee: Secondary | ICD-10-CM | POA: Diagnosis not present

## 2022-06-07 NOTE — Progress Notes (Signed)
Subjective:    Patient ID: Tracy Mcgee, female    DOB: Sep 04, 1965, 57 y.o.   MRN: [id number]  Knee Pain   05/22/22 Patient is a very pleasant 57 year old Caucasian female who presents today with a 1 month history of left knee pain.  The pain is located primarily in the posterior aspect of the knee.  She reports that she flexes her knee she will feel tightness in the back of her knee.  It aches and blocks.  If she rest he does not pain however when she stands she develops a stabbing pain within the knee joint.  It hurts to walk for a few feet.  She denies any instability or laxity in the knee joint.  Today on exam there is no laxity to varus or valgus stress.  She has a negative anterior and posterior drawer sign.  There is no erythema or effusion however she does have pain with Apley grind.  She also can stiffness in the anterior part of her knee.  She denies any falls or injuries.  She is unable to take NSAIDs due to chronic kidney disease.  At that time, my plan was: Not certain of the diagnosis.  Begin by obtaining an x-ray of the knee.  Certainly could be osteoarthritis in the posterior lateral portion of the knee.  However based on her exam I am also concerned about a lateral meniscal tear.  We discussed a cortisone injection and she would like to proceed with x-ray first.  She will also try Voltaren gel 4 times daily  06/07/22 Trace patellofemoral and medial compartment osteophytosis with mild medial joint space narrowing.   Patient returns today for cortisone injection.  She states that she is having pain over the lateral compartment of her knee with ambulation.  She is unable to put her weight fully on her left leg due to pain.  X-ray was unremarkable and certainly does not give explanation for the pain.  She is tender to palpation over the posterior lateral compartment of the knee and has pain with Apley grind today on exam     Past Medical History:  Diagnosis Date   Arthritis     CKD (chronic kidney disease) stage 3, GFR 30-59 ml/min (HCC)    Hypertension    No past surgical history on file. Current Outpatient Medications on File Prior to Visit  Medication Sig Dispense Refill   Multiple Vitamins-Minerals (MULTIVITAMIN WITH MINERALS) tablet Take 1 tablet by mouth daily.     valsartan (DIOVAN) 80 MG tablet Take 1 tablet (80 mg total) by mouth daily. 90 tablet 3   No current facility-administered medications on file prior to visit.   No Known Allergies Social History   Socioeconomic History   Marital status: Married    Spouse name: Not on file   Number of children: Not on file   Years of education: Not on file   Highest education level: Not on file  Occupational History   Not on file  Tobacco Use   Smoking status: Never   Smokeless tobacco: Never  Substance and Sexual Activity   Alcohol use: No   Drug use: No   Sexual activity: Yes  Other Topics Concern   Not on file  Social History Narrative   Not on file   Social Determinants of Health   Financial Resource Strain: Not on file  Food Insecurity: Not on file  Transportation Needs: Not on file  Physical Activity: Not on file  Stress: Not  on file  Social Connections: Not on file  Intimate Partner Violence: Not on file      Review of Systems  All other systems reviewed and are negative.      Objective:   Physical Exam Vitals reviewed.  Constitutional:      General: She is not in acute distress.    Appearance: She is well-developed. She is not diaphoretic.  HENT:     Head: Normocephalic and atraumatic.  Cardiovascular:     Rate and Rhythm: Normal rate and regular rhythm.     Heart sounds: Normal heart sounds. No murmur heard.    No friction rub. No gallop.  Pulmonary:     Effort: Pulmonary effort is normal. No respiratory distress.     Breath sounds: Normal breath sounds. No wheezing or rales.  Musculoskeletal:     Left knee: No swelling, deformity or effusion. Decreased range of  motion. Tenderness present. No medial joint line, lateral joint line, MCL, LCL, ACL or PCL tenderness. Abnormal meniscus.           Assessment & Plan:  Acute pain of left knee X-ray was unremarkable.  I feel the patient is torn her lateral meniscus of her left knee.  Using sterile technique, I injected the left knee with 2 cc of lidocaine, 2 cc of Marcaine, and 2 cc of 40 mg/mL Kenalog.  Recheck in 1 week.  If no better proceed with MRI versus orthopedic consultation

## 2022-06-19 ENCOUNTER — Ambulatory Visit
Admission: RE | Admit: 2022-06-19 | Discharge: 2022-06-19 | Disposition: A | Discharge status: Home / Self Care | Payer: BC Managed Care – PPO | Source: Ambulatory Visit | Attending: Family Medicine | Admitting: Family Medicine

## 2022-06-19 DIAGNOSIS — Z1231 Encounter for screening mammogram for malignant neoplasm of breast: Secondary | ICD-10-CM

## 2022-09-04 ENCOUNTER — Ambulatory Visit: Payer: BC Managed Care – PPO | Admitting: Family Medicine

## 2022-09-04 ENCOUNTER — Encounter: Payer: Self-pay | Admitting: Family Medicine

## 2022-09-04 VITALS — BP 126/78 | HR 83 | Temp 98.6°F | Ht 66.0 in | Wt 237.0 lb

## 2022-09-04 DIAGNOSIS — S83262A Peripheral tear of lateral meniscus, current injury, left knee, initial encounter: Secondary | ICD-10-CM

## 2022-09-04 MED ORDER — PREDNISONE 20 MG PO TABS: ORAL_TABLET | ORAL | 0 refills | Status: DC

## 2022-09-04 NOTE — Progress Notes (Signed)
Subjective:    Patient ID: Tracy Mcgee, female    DOB: Dec 04, 1965, 57 y.o.   MRN: [id number]  Knee Pain   05/22/22 Patient is a very pleasant 57 year old Caucasian female who presents today with a 1 month history of left knee pain.  The pain is located primarily in the posterior aspect of the knee.  She reports that she flexes her knee she will feel tightness in the back of her knee.  It aches and blocks.  If she rest he does not pain however when she stands she develops a stabbing pain within the knee joint.  It hurts to walk for a few feet.  She denies any instability or laxity in the knee joint.  Today on exam there is no laxity to varus or valgus stress.  She has a negative anterior and posterior drawer sign.  There is no erythema or effusion however she does have pain with Apley grind.  She also can stiffness in the anterior part of her knee.  She denies any falls or injuries.  She is unable to take NSAIDs due to chronic kidney disease.  At that time, my plan was: Not certain of the diagnosis.  Begin by obtaining an x-ray of the knee.  Certainly could be osteoarthritis in the posterior lateral portion of the knee.  However based on her exam I am also concerned about a lateral meniscal tear.  We discussed a cortisone injection and she would like to proceed with x-ray first.  She will also try Voltaren gel 4 times daily  06/07/22 Trace patellofemoral and medial compartment osteophytosis with mild medial joint space narrowing.   Patient returns today for cortisone injection.  She states that she is having pain over the lateral compartment of her knee with ambulation.  She is unable to put her weight fully on her left leg due to pain.  X-ray was unremarkable and certainly does not give explanation for the pain.  She is tender to palpation over the posterior lateral compartment of the knee and has pain with Apley grind today on exam  At that time, my plan was: X-ray was unremarkable.  I feel the  patient is torn her lateral meniscus of her left knee.  Using sterile technique, I injected the left knee with 2 cc of lidocaine, 2 cc of Marcaine, and 2 cc of 40 mg/mL Kenalog.  Recheck in 1 week.  If no better proceed with MRI versus orthopedic consultation  09/04/22 Patient received 1 to 2 months of benefit from the cortisone injection however the left posterior knee pain has returned and is now more serious than before.  She is walking with an antalgic gait.  She reports a stabbing pain behind her knee whenever she stands.  She denies any pain as long as she sitting but as soon as she puts weight on her legs she has left posterior knee pain.  Tylenol and NSAIDs are not helping.   Past Medical History:  Diagnosis Date   Arthritis    CKD (chronic kidney disease) stage 3, GFR 30-59 ml/min (HCC)    Hypertension    No past surgical history on file. Current Outpatient Medications on File Prior to Visit  Medication Sig Dispense Refill   Multiple Vitamins-Minerals (MULTIVITAMIN WITH MINERALS) tablet Take 1 tablet by mouth daily.     valsartan (DIOVAN) 80 MG tablet Take 1 tablet (80 mg total) by mouth daily. 90 tablet 3   No current facility-administered medications on file prior  to visit.   No Known Allergies Social History   Socioeconomic History   Marital status: Married    Spouse name: Not on file   Number of children: Not on file   Years of education: Not on file   Highest education level: Associate degree: occupational, Scientist, product/process development, or vocational program  Occupational History   Not on file  Tobacco Use   Smoking status: Never   Smokeless tobacco: Never  Substance and Sexual Activity   Alcohol use: No   Drug use: No   Sexual activity: Yes  Other Topics Concern   Not on file  Social History Narrative   Not on file   Social Determinants of Health   Financial Resource Strain: Low Risk  (08/30/2022)   Overall Financial Resource Strain (CARDIA)    Difficulty of Paying Living  Expenses: Not very hard  Food Insecurity: No Food Insecurity (08/30/2022)   Hunger Vital Sign    Worried About Running Out of Food in the Last Year: Never true    Ran Out of Food in the Last Year: Never true  Transportation Needs: No Transportation Needs (08/30/2022)   PRAPARE - Administrator, Civil Service (Medical): No    Lack of Transportation (Non-Medical): No  Physical Activity: Unknown (08/30/2022)   Exercise Vital Sign    Days of Exercise per Week: Patient declined    Minutes of Exercise per Session: Not on file  Stress: No Stress Concern Present (08/30/2022)   [employer] of Occupational Health - Occupational Stress Questionnaire    Feeling of Stress : Not at all  Social Connections: Moderately Integrated (08/30/2022)   Social Connection and Isolation Panel [NHANES]    Frequency of Communication with Friends and Family: Three times a week    Frequency of Social Gatherings with Friends and Family: Once a week    Attends Religious Services: More than 4 times per year    Active Member of [employer] or Organizations: No    Attends Engineer, structural: Not on file    Marital Status: Married  Catering manager Violence: Not on file      Review of Systems  All other systems reviewed and are negative.      Objective:   Physical Exam Vitals reviewed.  Constitutional:      General: She is not in acute distress.    Appearance: She is well-developed. She is not diaphoretic.  HENT:     Head: Normocephalic and atraumatic.  Cardiovascular:     Rate and Rhythm: Normal rate and regular rhythm.     Heart sounds: Normal heart sounds. No murmur heard.    No friction rub. No gallop.  Pulmonary:     Effort: Pulmonary effort is normal. No respiratory distress.     Breath sounds: Normal breath sounds. No wheezing or rales.  Musculoskeletal:     Left knee: No swelling, deformity or effusion. Decreased range of motion. Tenderness present. No medial joint line, lateral  joint line, MCL, LCL, ACL or PCL tenderness. Abnormal meniscus.           Assessment & Plan:  Acute lateral meniscal tear with peripheral detachment, left, initial encounter - Plan: MR Knee Left  Wo Contrast Patient needs an MRI of the left knee.  I feel certain that she has torn her left lateral meniscus.  She has tried tincture of time for more than 3 months.  She tried cortisone injections.  She has tried NSAIDs.  She is tried Tylenol  and nothing has helped.  The pain is worse today than it was initially.  The patient may require surgery to repair a torn meniscus.  Therefore proceed with MRI

## 2022-09-13 ENCOUNTER — Other Ambulatory Visit: Payer: Self-pay | Admitting: Family Medicine

## 2022-09-14 NOTE — Telephone Encounter (Signed)
Requested Prescriptions  Pending Prescriptions Disp Refills   valsartan (DIOVAN) 80 MG tablet [Pharmacy Med Name: Valsartan 80 MG Oral Tablet] 90 tablet 0    Sig: TAKE 1 TABLET BY MOUTH DAILY     Cardiovascular:  Angiotensin Receptor Blockers Failed - 09/13/2022 10:15 PM      Failed - Cr in normal range and within 180 days    Creat  Date Value Ref Range Status  08/28/2021 1.10 (H) 0.50 - 1.03 mg/dL Final         Failed - K in normal range and within 180 days    Potassium  Date Value Ref Range Status  08/28/2021 4.0 3.5 - 5.3 mmol/L Final         Failed - Valid encounter within last 6 months    Recent Outpatient Visits           1 year ago Low back pain at multiple sites   [facility] Pickard, Priscille Heidelberg, MD   5 years ago Fever, unspecified fever cause   [facility] Tanya Nones, Priscille Heidelberg, MD   8 years ago Other malaise and fatigue   [facility] Tanya Nones, Priscille Heidelberg, MD   9 years ago Low back pain   [facility] Tanya Nones, Priscille Heidelberg, MD   10 years ago Ankle pain   [facility], Asencion Partridge, MD       Future Appointments             In 1 month Pickard, Priscille Heidelberg, MD [facility], [facility]            Passed - Patient is not pregnant      Passed - Last BP in normal range    BP Readings from Last 1 Encounters:  09/04/22 126/78

## 2022-09-27 ENCOUNTER — Ambulatory Visit (HOSPITAL_COMMUNITY)
Admission: RE | Admit: 2022-09-27 | Discharge: 2022-09-27 | Disposition: A | Discharge status: Home / Self Care | Payer: BC Managed Care – PPO | Source: Ambulatory Visit | Attending: Family Medicine | Admitting: Family Medicine

## 2022-09-27 DIAGNOSIS — S83262A Peripheral tear of lateral meniscus, current injury, left knee, initial encounter: Secondary | ICD-10-CM | POA: Diagnosis not present

## 2022-09-27 DIAGNOSIS — M25462 Effusion, left knee: Secondary | ICD-10-CM | POA: Diagnosis not present

## 2022-09-27 DIAGNOSIS — S86112A Strain of other muscle(s) and tendon(s) of posterior muscle group at lower leg level, left leg, initial encounter: Secondary | ICD-10-CM | POA: Diagnosis not present

## 2022-09-27 DIAGNOSIS — S83232A Complex tear of medial meniscus, current injury, left knee, initial encounter: Secondary | ICD-10-CM | POA: Diagnosis not present

## 2022-09-27 DIAGNOSIS — M948X6 Other specified disorders of cartilage, lower leg: Secondary | ICD-10-CM | POA: Diagnosis not present

## 2022-10-03 ENCOUNTER — Encounter: Payer: Self-pay | Admitting: Family Medicine

## 2022-10-03 ENCOUNTER — Telehealth: Payer: Self-pay

## 2022-10-03 NOTE — Telephone Encounter (Signed)
My Chart message from patient:  Good morning. I had my MRI done on June 20th, and have not heard back yet with the results. Was just curious if there was a problem or if this is a normal wait time?  It does not look like the MRI has been read yet. Thanks.

## 2022-10-08 ENCOUNTER — Other Ambulatory Visit: Payer: Self-pay | Admitting: Family Medicine

## 2022-10-08 DIAGNOSIS — S83282A Other tear of lateral meniscus, current injury, left knee, initial encounter: Secondary | ICD-10-CM

## 2022-10-08 NOTE — Telephone Encounter (Signed)
Pt returned call and is aware of pcp's MRI note and recommendations. Pt has an appt 10/15/22 and will discuss more in details w/pcp then.

## 2022-10-08 NOTE — Telephone Encounter (Signed)
Tried to call pt this morning. NA, LVM for pt to return call re: pcp's msg "She has a complex tear the posterior horn of the medial meniscus with peripheral meniscal extrusion.  I will consult ortho to discuss surgery. "

## 2022-10-15 ENCOUNTER — Telehealth: Payer: BC Managed Care – PPO

## 2022-10-15 ENCOUNTER — Ambulatory Visit: Payer: BC Managed Care – PPO | Admitting: Orthopedic Surgery

## 2022-10-15 ENCOUNTER — Ambulatory Visit: Payer: BC Managed Care – PPO | Admitting: Family Medicine

## 2022-10-15 DIAGNOSIS — M1712 Unilateral primary osteoarthritis, left knee: Secondary | ICD-10-CM | POA: Diagnosis not present

## 2022-10-15 NOTE — Telephone Encounter (Signed)
Auth needed for left knee gel 

## 2022-10-17 ENCOUNTER — Encounter: Payer: Self-pay | Admitting: Orthopedic Surgery

## 2022-10-17 MED ORDER — LIDOCAINE HCL 1 % IJ SOLN
5.0000 mL | INTRAMUSCULAR | Status: AC | PRN
Start: 2022-10-15 — End: 2022-10-15
  Administered 2022-10-15: 5 mL

## 2022-10-17 MED ORDER — METHYLPREDNISOLONE ACETATE 40 MG/ML IJ SUSP
40.0000 mg | INTRAMUSCULAR | Status: AC | PRN
Start: 2022-10-15 — End: 2022-10-15
  Administered 2022-10-15: 40 mg via INTRA_ARTICULAR

## 2022-10-17 MED ORDER — BUPIVACAINE HCL 0.25 % IJ SOLN
4.0000 mL | INTRAMUSCULAR | Status: AC | PRN
Start: 2022-10-15 — End: 2022-10-15
  Administered 2022-10-15: 4 mL via INTRA_ARTICULAR

## 2022-10-17 NOTE — Telephone Encounter (Signed)
VOB submitted for Orthovisc, left knee  

## 2022-10-17 NOTE — Progress Notes (Signed)
Office Visit Note   Patient: Tracy Mcgee           Date of Birth: [birth year]           MRN: [id number] Visit Date: 10/15/2022 Requested by: Donita Brooks, MD [address] [place],  [place] [address] PCP: Donita Brooks, MD  Subjective: Chief Complaint  Patient presents with   Left Knee - Pain    HPI: Tracy Mcgee is a 57 y.o. female who presents to the office reporting left knee pain for 1 year.  Has gotten worse over time.  Denies any history of injury.  She does state however that about a month ago she was going up the stairs and felt a pop in the posterior aspect of her knee.  She had a lot of limping at that time but now is back to baseline.  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  She has good and bad days.  Increased pain with certain motions and activity.  Does describe some mechanical symptoms of locking popping swelling and stiffness as well as weakness and giving way.  She had cortisone injection in February with about 2 months relief.  A 6-day Dosepak also helped.  No prior gel injection no prior physical therapy.  She used to do MMA.  She has tried rest as well.  Tried prednisone with some relief as stated.  Going on a trip in 2 weeks to [place]..                ROS: All systems reviewed are negative as they relate to the chief complaint within the history of present illness.  Patient denies fevers or chills.  Assessment & Plan: Visit Diagnoses: No diagnosis found.  Plan: Impression is left knee arthritis with degenerative meniscal tear and significant cartilage abnormalities and joint space narrowing in all 3 compartments.  Plan is aspiration and injection of that left knee today.  We will preapproved gel injection for the left knee as well.  Follow-up after her trip when the knee cortisone injection starts to wear off.  Follow-Up Instructions: No follow-ups on file.   Orders:  No orders of the defined types were placed in this  encounter.  No orders of the defined types were placed in this encounter.     Procedures: Large Joint Inj: L knee on 10/15/2022 12:24 PM Indications: diagnostic evaluation, joint swelling and pain Details: 18 G 1.5 in needle, superolateral approach  Arthrogram: No  Medications: 5 mL lidocaine 1 %; 40 mg methylPREDNISolone acetate 40 MG/ML; 4 mL bupivacaine 0.25 % Outcome: tolerated well, no immediate complications Procedure, treatment alternatives, risks and benefits explained, specific risks discussed. Consent was given by the patient. Immediately prior to procedure a time out was called to verify the correct patient, procedure, equipment, support staff and site/side marked as required. Patient was prepped and draped in the usual sterile fashion.       Clinical Data: No additional findings.  Objective: Vital Signs: There were no vitals taken for this visit.  Physical Exam:  Constitutional: Patient appears well-developed HEENT:  Head: Normocephalic Eyes:EOM are normal Neck: Normal range of motion Cardiovascular: Normal rate Pulmonary/chest: Effort normal Neurologic: Patient is alert Skin: Skin is warm Psychiatric: Patient has normal mood and affect  Ortho Exam: Ortho exam demonstrates full active and passive range of motion of the right knee.  Left knee has range of motion of 5-1 10.  Trace effusion present.  Medial greater than lateral joint line tenderness is present.  Collateral crucial ligaments are stable.  No masses lymphadenopathy or skin changes noted in that left knee region.  This patient is diagnosed with osteoarthritis of the knee(s).    Radiographs show evidence of joint space narrowing, osteophytes, subchondral sclerosis and/or subchondral cysts.  This patient has knee pain which interferes with functional and activities of daily living.    This patient has experienced inadequate response, adverse effects and/or intolerance with conservative treatments such as  acetaminophen, NSAIDS, topical creams, physical therapy or regular exercise, knee bracing and/or weight loss.   This patient has experienced inadequate response or has a contraindication to intra articular steroid injections for at least 3 months.   This patient is not scheduled to have a total knee replacement within 6 months of starting treatment with viscosupplementation.  Specialty Comments:  No specialty comments available.  Imaging: No results found.   PMFS History: Patient Active Problem List   Diagnosis Date Noted   Acute pain of left knee 05/03/2022   Past Medical History:  Diagnosis Date   Arthritis    CKD (chronic kidney disease) stage 3, GFR 30-59 ml/min (HCC)    Hypertension     Family History  Problem Relation Age of Onset   Colitis Mother    Heart disease Father    Heart disease Brother    Crohn's disease Son    Alzheimer's disease Maternal Grandmother    Leukemia Paternal Grandmother    Heart disease Paternal Grandfather     No past surgical history on file. Social History   Occupational History   Not on file  Tobacco Use   Smoking status: Never   Smokeless tobacco: Never  Substance and Sexual Activity   Alcohol use: No   Drug use: No   Sexual activity: Yes

## 2022-11-01 ENCOUNTER — Telehealth: Payer: Self-pay

## 2022-11-01 NOTE — Telephone Encounter (Signed)
VOB submitted for Euflexxa due to Orthovisc not being a preferred product through insurance.

## 2022-11-09 ENCOUNTER — Telehealth: Payer: Self-pay

## 2022-11-09 NOTE — Telephone Encounter (Signed)
PA has been started online through Euflexxa portal for Euflexxa, left knee. PA pending

## 2022-11-20 ENCOUNTER — Other Ambulatory Visit: Payer: Self-pay | Admitting: Family Medicine

## 2022-11-22 NOTE — Telephone Encounter (Signed)
Requested medication (s) are due for refill today: no  Requested medication (s) are on the active medication list: yes Last refill:  09/14/22  Future visit scheduled: no Notes to clinic:  lab work out of range (cr)   Requested Prescriptions  Pending Prescriptions Disp Refills   valsartan (DIOVAN) 80 MG tablet [Pharmacy Med Name: Valsartan 80 MG Oral Tablet] 90 tablet 3    Sig: TAKE 1 TABLET BY MOUTH DAILY     Cardiovascular:  Angiotensin Receptor Blockers Failed - 11/20/2022 10:53 PM      Failed - Cr in normal range and within 180 days    Creat  Date Value Ref Range Status  08/28/2021 1.10 (H) 0.50 - 1.03 mg/dL Final         Failed - K in normal range and within 180 days    Potassium  Date Value Ref Range Status  08/28/2021 4.0 3.5 - 5.3 mmol/L Final         Failed - Valid encounter within last 6 months    Recent Outpatient Visits           1 year ago Low back pain at multiple sites   [facility] Pickard, Priscille Heidelberg, MD   5 years ago Fever, unspecified fever cause   [facility] Tanya Nones, Priscille Heidelberg, MD   8 years ago Other malaise and fatigue   [facility] Tanya Nones, Priscille Heidelberg, MD   9 years ago Low back pain   [facility] Tanya Nones, Priscille Heidelberg, MD   10 years ago Ankle pain   [facility], Asencion Partridge, MD              Passed - Patient is not pregnant      Passed - Last BP in normal range    BP Readings from Last 1 Encounters:  09/04/22 126/78

## 2023-01-30 ENCOUNTER — Telehealth: Payer: Self-pay

## 2023-01-30 ENCOUNTER — Ambulatory Visit: Payer: BC Managed Care – PPO | Admitting: Orthopedic Surgery

## 2023-01-30 DIAGNOSIS — M1712 Unilateral primary osteoarthritis, left knee: Secondary | ICD-10-CM | POA: Diagnosis not present

## 2023-01-30 NOTE — Telephone Encounter (Signed)
Auth needed for gel injection

## 2023-01-31 ENCOUNTER — Other Ambulatory Visit: Payer: Self-pay

## 2023-01-31 DIAGNOSIS — M1712 Unilateral primary osteoarthritis, left knee: Secondary | ICD-10-CM

## 2023-01-31 NOTE — Telephone Encounter (Signed)
VOB was previously submitted. Talked with patient and appts.have been scheduled

## 2023-02-01 ENCOUNTER — Telehealth: Payer: Self-pay

## 2023-02-01 ENCOUNTER — Encounter: Payer: Self-pay | Admitting: Orthopedic Surgery

## 2023-02-01 NOTE — Telephone Encounter (Signed)
-----   Message from Barbette Or sent at 02/01/2023  8:29 AM EDT -----  ----- Message ----- From: Cammy Copa, MD Sent: 02/01/2023   7:44 AM EDT To: Prescott Parma, RT  Please approve gel shot left knee thanks

## 2023-02-01 NOTE — Progress Notes (Signed)
Office Visit Note   Patient: Tracy Mcgee           Date of Birth: Nov 30, 1965           MRN: [id number] Visit Date: 01/30/2023 Requested by: Donita Brooks, MD [address] [place],  [place] [address] PCP: Donita Brooks, MD  Subjective: Chief Complaint  Patient presents with   Left Knee - Follow-up    HPI: Tracy Mcgee is a 57 y.o. female who presents to the office reporting left knee pain.  Had prior cortisone injection 10/15/2022 which gave her good relief until mid September.  She did okay when she went to [place].  Currently she is having good and bad days.  The pain does not wake her from sleep at night but she does sleep with a pillow between her knees.  Reports increased pain with colder temps.  Patient has had no prior knee surgery..                ROS: All systems reviewed are negative as they relate to the chief complaint within the history of present illness.  Patient denies fevers or chills.  Assessment & Plan: Visit Diagnoses: No diagnosis found.  Plan: Impression is left knee arthritis.  She would like to consider gel injection and we will get that preapproved for her in the near future.  Follow-up with [place] at that time for that injection.  This patient is diagnosed with osteoarthritis of the knee(s).    Radiographs show evidence of joint space narrowing, osteophytes, subchondral sclerosis and/or subchondral cysts.  This patient has knee pain which interferes with functional and activities of daily living.    This patient has experienced inadequate response, adverse effects and/or intolerance with conservative treatments such as acetaminophen, NSAIDS, topical creams, physical therapy or regular exercise, knee bracing and/or weight loss.   This patient has experienced inadequate response or has a contraindication to intra articular steroid injections for at least 3 months.   This patient is not scheduled to have a total knee replacement within 6  months of starting treatment with viscosupplementation.  Follow-Up Instructions: No follow-ups on file.   Orders:  No orders of the defined types were placed in this encounter.  No orders of the defined types were placed in this encounter.     Procedures: No procedures performed   Clinical Data: No additional findings.  Objective: Vital Signs: There were no vitals taken for this visit.  Physical Exam:  Constitutional: Patient appears well-developed HEENT:  Head: Normocephalic Eyes:EOM are normal Neck: Normal range of motion Cardiovascular: Normal rate Pulmonary/chest: Effort normal Neurologic: Patient is alert Skin: Skin is warm Psychiatric: Patient has normal mood and affect  Ortho Exam: Left knee exam demonstrates reasonable range of motion with intact extensor mechanism and no groin pain with internal/external rotation of the leg.  Pedal pulses palpable.  No definite effusion present.  Medial greater than lateral joint line tenderness.  Specialty Comments:  No specialty comments available.  Imaging: No results found.   PMFS History: Patient Active Problem List   Diagnosis Date Noted   Acute pain of left knee 05/03/2022   Past Medical History:  Diagnosis Date   Arthritis    CKD (chronic kidney disease) stage 3, GFR 30-59 ml/min (HCC)    Hypertension     Family History  Problem Relation Age of Onset   Colitis Mother    Heart disease Father    Heart disease  Brother    Crohn's disease Son    Alzheimer's disease Maternal Grandmother    Leukemia Paternal Grandmother    Heart disease Paternal Grandfather     History reviewed. No pertinent surgical history. Social History   Occupational History   Not on file  Tobacco Use   Smoking status: Never   Smokeless tobacco: Never  Substance and Sexual Activity   Alcohol use: No   Drug use: No   Sexual activity: Yes

## 2023-02-01 NOTE — Telephone Encounter (Signed)
Patient previously approved for gel injections.  Appts.have been made

## 2023-02-07 ENCOUNTER — Ambulatory Visit: Payer: BC Managed Care – PPO | Admitting: Surgical

## 2023-02-07 ENCOUNTER — Encounter: Payer: Self-pay | Admitting: Surgical

## 2023-02-07 DIAGNOSIS — M1712 Unilateral primary osteoarthritis, left knee: Secondary | ICD-10-CM | POA: Diagnosis not present

## 2023-02-07 MED ORDER — SODIUM HYALURONATE (VISCOSUP) 20 MG/2ML IX SOSY
20.0000 mg | PREFILLED_SYRINGE | INTRA_ARTICULAR | Status: AC | PRN
  Administered 2023-02-07: 20 mg via INTRA_ARTICULAR

## 2023-02-07 MED ORDER — LIDOCAINE HCL 1 % IJ SOLN
5.0000 mL | INTRAMUSCULAR | Status: AC | PRN
  Administered 2023-02-07: 5 mL

## 2023-02-07 NOTE — Progress Notes (Signed)
   Procedure Note  Patient: Tracy Mcgee             Date of Birth: 1966-03-06           MRN: [id number]             Visit Date: 02/07/2023  Procedures: Visit Diagnoses:  1. Arthritis of left knee     Large Joint Inj: L knee on 02/07/2023 4:58 PM Indications: diagnostic evaluation, joint swelling and pain Details: 18 G 1.5 in needle, superolateral approach  Arthrogram: No  Medications: 5 mL lidocaine 1 %; 20 mg Sodium Hyaluronate (Viscosup) 20 MG/2ML Outcome: tolerated well, no immediate complications Procedure, treatment alternatives, risks and benefits explained, specific risks discussed. Consent was given by the patient. Immediately prior to procedure a time out was called to verify the correct patient, procedure, equipment, support staff and site/side marked as required. Patient was prepped and draped in the usual sterile fashion.

## 2023-02-18 ENCOUNTER — Encounter: Payer: Self-pay | Admitting: Surgical

## 2023-02-18 ENCOUNTER — Ambulatory Visit: Payer: BC Managed Care – PPO | Admitting: Surgical

## 2023-02-18 DIAGNOSIS — M1712 Unilateral primary osteoarthritis, left knee: Secondary | ICD-10-CM

## 2023-02-18 MED ORDER — LIDOCAINE HCL 1 % IJ SOLN
5.0000 mL | INTRAMUSCULAR | Status: AC | PRN
  Administered 2023-02-18: 5 mL

## 2023-02-18 MED ORDER — SODIUM HYALURONATE (VISCOSUP) 20 MG/2ML IX SOSY
20.0000 mg | PREFILLED_SYRINGE | INTRA_ARTICULAR | Status: AC | PRN
  Administered 2023-02-18: 20 mg via INTRA_ARTICULAR

## 2023-02-18 NOTE — Progress Notes (Signed)
   Procedure Note  Patient: Tracy Mcgee             Date of Birth: November 07, 1965           MRN: [id number]             Visit Date: 02/18/2023  Procedures: Visit Diagnoses: No diagnosis found.  Large Joint Inj: L knee on 02/18/2023 4:31 PM Indications: diagnostic evaluation, joint swelling and pain Details: 18 G 1.5 in needle, superolateral approach  Arthrogram: No  Medications: 5 mL lidocaine 1 %; 20 mg Sodium Hyaluronate (Viscosup) 20 MG/2ML Outcome: tolerated well, no immediate complications  Patient returns following first Euflexxa injection about a week ago.  Had increased pain after the injection for about 24 hours around the injection site but 2 days afterward she had significant improvement in her baseline knee pain.  Here today for second injection. Procedure, treatment alternatives, risks and benefits explained, specific risks discussed. Consent was given by the patient. Immediately prior to procedure a time out was called to verify the correct patient, procedure, equipment, support staff and site/side marked as required. Patient was prepped and draped in the usual sterile fashion.

## 2023-02-25 ENCOUNTER — Telehealth: Payer: Self-pay

## 2023-02-25 ENCOUNTER — Ambulatory Visit: Payer: BC Managed Care – PPO | Admitting: Orthopedic Surgery

## 2023-02-25 DIAGNOSIS — M1712 Unilateral primary osteoarthritis, left knee: Secondary | ICD-10-CM

## 2023-02-25 NOTE — Telephone Encounter (Signed)
Auth needed for repeat left knee gel in 6 months

## 2023-02-26 NOTE — Telephone Encounter (Signed)
Noted.  Next available gel injection would need to be after 08/25/2023 Will submit in Dennis Hegeman, 2025.

## 2023-03-01 ENCOUNTER — Encounter: Payer: Self-pay | Admitting: Orthopedic Surgery

## 2023-03-01 DIAGNOSIS — M1712 Unilateral primary osteoarthritis, left knee: Secondary | ICD-10-CM | POA: Diagnosis not present

## 2023-03-01 MED ORDER — SODIUM HYALURONATE (VISCOSUP) 20 MG/2ML IX SOSY
20.0000 mg | PREFILLED_SYRINGE | INTRA_ARTICULAR | Status: AC | PRN
  Administered 2023-03-01: 20 mg via INTRA_ARTICULAR

## 2023-03-01 MED ORDER — LIDOCAINE HCL 1 % IJ SOLN
5.0000 mL | INTRAMUSCULAR | Status: AC | PRN
  Administered 2023-03-01: 5 mL

## 2023-03-01 NOTE — Progress Notes (Signed)
   Procedure Note  Patient: Tracy Mcgee             Date of Birth: 1965/11/12           MRN: [id number]             Visit Date: 02/25/2023  Procedures: Visit Diagnoses:  1. Arthritis of left knee     Large Joint Inj: L knee on 03/01/2023 6:40 AM Indications: pain, joint swelling and diagnostic evaluation Details: 18 G 1.5 in needle, superolateral approach  Arthrogram: No  Medications: 5 mL lidocaine 1 %; 20 mg Sodium Hyaluronate (Viscosup) 20 MG/2ML Outcome: tolerated well, no immediate complications Procedure, treatment alternatives, risks and benefits explained, specific risks discussed. Consent was given by the patient. Immediately prior to procedure a time out was called to verify the correct patient, procedure, equipment, support staff and site/side marked as required. Patient was prepped and draped in the usual sterile fashion.    Patient is getting good relief from the 2 prior injections.  Aspiration also performed today.  Lot number [id number] This patient is diagnosed with osteoarthritis of the knee(s).    Radiographs show evidence of joint space narrowing, osteophytes, subchondral sclerosis and/or subchondral cysts.  This patient has knee pain which interferes with functional and activities of daily living.    This patient has experienced inadequate response, adverse effects and/or intolerance with conservative treatments such as acetaminophen, NSAIDS, topical creams, physical therapy or regular exercise, knee bracing and/or weight loss.   This patient has experienced inadequate response or has a contraindication to intra articular steroid injections for at least 3 months.   This patient is not scheduled to have a total knee replacement within 6 months of starting treatment with viscosupplementation.

## 2023-04-04 ENCOUNTER — Ambulatory Visit: Payer: BC Managed Care – PPO | Admitting: Family Medicine

## 2023-04-04 VITALS — BP 128/78 | HR 77 | Temp 97.8°F | Ht 66.0 in | Wt 237.0 lb

## 2023-04-04 DIAGNOSIS — J069 Acute upper respiratory infection, unspecified: Secondary | ICD-10-CM | POA: Diagnosis not present

## 2023-04-04 MED ORDER — AMOXICILLIN 875 MG PO TABS
875.0000 mg | ORAL_TABLET | Freq: Two times a day (BID) | ORAL | 0 refills | Status: AC
Start: 2023-04-04 — End: 2023-04-14

## 2023-04-04 MED ORDER — HYDROCODONE BIT-HOMATROP MBR 5-1.5 MG/5ML PO SOLN: 5.0000 mL | Freq: Three times a day (TID) | ORAL | 0 refills | Status: DC | PRN

## 2023-04-04 NOTE — Progress Notes (Signed)
Subjective:    Patient ID: Tracy Mcgee, female    DOB: 08-24-1965, 57 y.o.   MRN: [id number]  HPI Patient presents with a 1 week history of head congestion, rhinorrhea, postnasal drip, and generally feeling poorly.  She also has a nonproductive cough.  She denies any chest pain.  She denies any sinus pain.  She denies any high-grade fever.  She does complain of numbness on her anterior thighs bilaterally with prolonged standing.  She is able to walk and the numbness will go away.  It only occurs when she stands for a long period of time.  She has chronic low back pain and degenerative disc disease in the lumbar spine.  She denies any leg weakness. Past Medical History:  Diagnosis Date   Arthritis    CKD (chronic kidney disease) stage 3, GFR 30-59 ml/min (HCC)    Hypertension    No past surgical history on file. Current Outpatient Medications on File Prior to Visit  Medication Sig Dispense Refill   Multiple Vitamins-Minerals (MULTIVITAMIN WITH MINERALS) tablet Take 1 tablet by mouth daily.     valsartan (DIOVAN) 80 MG tablet TAKE 1 TABLET BY MOUTH DAILY 90 tablet 1   No current facility-administered medications on file prior to visit.   No Known Allergies Social History   Socioeconomic History   Marital status: Married    Spouse name: Not on file   Number of children: Not on file   Years of education: Not on file   Highest education level: Associate degree: occupational, Scientist, product/process development, or vocational program  Occupational History   Not on file  Tobacco Use   Smoking status: Never   Smokeless tobacco: Never  Substance and Sexual Activity   Alcohol use: No   Drug use: No   Sexual activity: Yes  Other Topics Concern   Not on file  Social History Narrative   Not on file   Social Drivers of Health   Financial Resource Strain: Low Risk  (04/03/2023)   Overall Financial Resource Strain (CARDIA)    Difficulty of Paying Living Expenses: Not hard at all  Food Insecurity: No Food  Insecurity (04/03/2023)   Hunger Vital Sign    Worried About Running Out of Food in the Last Year: Never true    Ran Out of Food in the Last Year: Never true  Transportation Needs: No Transportation Needs (04/03/2023)   PRAPARE - Administrator, Civil Service (Medical): No    Lack of Transportation (Non-Medical): No  Physical Activity: Insufficiently Active (04/03/2023)   Exercise Vital Sign    Days of Exercise per Week: 1 day    Minutes of Exercise per Session: 10 min  Stress: No Stress Concern Present (04/03/2023)   [employer] of Occupational Health - Occupational Stress Questionnaire    Feeling of Stress : Not at all  Social Connections: Socially Integrated (04/03/2023)   Social Connection and Isolation Panel [NHANES]    Frequency of Communication with Friends and Family: Twice a week    Frequency of Social Gatherings with Friends and Family: Once a week    Attends Religious Services: More than 4 times per year    Active Member of [employer] or Organizations: Yes    Attends Engineer, structural: More than 4 times per year    Marital Status: Married  Catering manager Violence: Not on file      Review of Systems  All other systems reviewed and are negative.  Objective:   Physical Exam Vitals reviewed.  Constitutional:      General: She is not in acute distress.    Appearance: She is well-developed. She is not diaphoretic.  HENT:     Head: Normocephalic and atraumatic.     Right Ear: Tympanic membrane and ear canal normal.     Left Ear: Tympanic membrane and ear canal normal.     Nose: Congestion and rhinorrhea present.     Mouth/Throat:     Mouth: Mucous membranes are moist.     Pharynx: No oropharyngeal exudate or posterior oropharyngeal erythema.  Eyes:     Conjunctiva/sclera: Conjunctivae normal.  Cardiovascular:     Rate and Rhythm: Normal rate and regular rhythm.     Heart sounds: Normal heart sounds. No murmur heard.    No friction  rub. No gallop.  Pulmonary:     Effort: Pulmonary effort is normal. No respiratory distress.     Breath sounds: Normal breath sounds. No wheezing or rales.  Abdominal:     General: Bowel sounds are normal.     Palpations: Abdomen is soft.  Musculoskeletal:     Lumbar back: Spasms and tenderness present. No bony tenderness. Decreased range of motion. Negative right straight leg raise test and negative left straight leg raise test.       Back:  Neurological:     General: No focal deficit present.     Mental Status: She is oriented to person, place, and time. Mental status is at baseline.     Cranial Nerves: No cranial nerve deficit.     Motor: No weakness.     Coordination: Coordination normal.     Gait: Gait normal.     Deep Tendon Reflexes: Reflexes normal.           Assessment & Plan:  Viral upper respiratory tract infection Patient has a viral upper respiratory infection.  I recommended Mucinex DM for cough, Coricidin for head congestion and tincture of time.  If she develops a secondary sinus infection I would use amoxicillin however at the present time symptoms sound consistent with a viral illness.  The numbness in her thighs sound like lumbar radiculopathy likely from spinal stenosis.  Patient had an x-ray last year that confirmed degenerative disc disease.  We discussed getting an MRI but the patient declines that at the present time.  We discussed warning signs that would warrant emergent workup

## 2023-04-22 ENCOUNTER — Other Ambulatory Visit: Payer: Self-pay | Admitting: Family Medicine

## 2023-04-24 NOTE — Telephone Encounter (Signed)
 Requested medication (s) are due for refill today:   Yes  Requested medication (s) are on the active medication list:   Yes  Future visit scheduled:   No.    LOV 04/04/2023 for URI   Last ordered: 12/04/2022 #90, 1 refill  Unable to refill because her physical and labs are due.      Requested Prescriptions  Pending Prescriptions Disp Refills   valsartan  (DIOVAN ) 80 MG tablet [Pharmacy Med Name: Valsartan  80 MG Oral Tablet] 90 tablet 3    Sig: TAKE 1 TABLET BY MOUTH DAILY     Cardiovascular:  Angiotensin Receptor Blockers Failed - 04/24/2023  7:44 AM      Failed - Cr in normal range and within 180 days    Creat  Date Value Ref Range Status  08/28/2021 1.10 (H) 0.50 - 1.03 mg/dL Final         Failed - K in normal range and within 180 days    Potassium  Date Value Ref Range Status  08/28/2021 4.0 3.5 - 5.3 mmol/L Final         Failed - Valid encounter within last 6 months    Recent Outpatient Visits           1 year ago Low back pain at multiple sites   [facility] Pickard, Cisco Crest, MD   6 years ago Fever, unspecified fever cause   [facility] Cheril Cork, Cisco Crest, MD   9 years ago Other malaise and fatigue   [facility] Cheril Cork, Cisco Crest, MD   10 years ago Low back pain   [facility] Cheril Cork, Cisco Crest, MD   10 years ago Ankle pain   [facility], Delpha Fickle, MD              Passed - Patient is not pregnant      Passed - Last BP in normal range    BP Readings from Last 1 Encounters:  04/04/23 128/78

## 2023-05-15 ENCOUNTER — Other Ambulatory Visit: Payer: Self-pay | Admitting: Family Medicine

## 2023-05-15 MED ORDER — VALSARTAN 80 MG PO TABS: 80.0000 mg | ORAL_TABLET | Freq: Every day | ORAL | 1 refills | Status: DC

## 2023-05-15 NOTE — Telephone Encounter (Signed)
 Copied from CRM [id number]. Topic: Clinical - Medication Refill >> May 15, 2023 10:09 AM Powell NOVAK wrote: Most Recent Primary Care Visit:  Provider: DUANNE LOWERS T  Department: [facility]  Visit Type: OFFICE VISIT  Date: 04/04/2023  Medication: valsartan  (DIOVAN ) 80 MG tablet  Has the patient contacted their pharmacy? Yes-needs sent from PCP (Agent: If no, request that the patient contact the pharmacy for the refill. If patient does not wish to contact the pharmacy document the reason why and proceed with request.) (Agent: If yes, when and what did the pharmacy advise?)  Is this the correct pharmacy for this prescription? yes If no, delete pharmacy and type the correct one.  This is the patient's preferred pharmacy:  [facility] - [place], [place] - [address] [place] [address] Phone: [phone] Fax: [phone]     Has the prescription been filled recently? no  Is the patient out of the medication? yes  Has the patient been seen for an appointment in the last year OR does the patient have an upcoming appointment? yes  Can we respond through MyChart? yes  Agent: Please be advised that Rx refills may take up to 3 business days. We ask that you follow-up with your pharmacy.

## 2023-06-25 ENCOUNTER — Inpatient Hospital Stay (HOSPITAL_COMMUNITY)
Admission: EM | Admit: 2023-06-25 | Discharge: 2023-06-29 | DRG: 419 | Disposition: A | Discharge status: Home / Self Care | Attending: Student | Admitting: Student

## 2023-06-25 ENCOUNTER — Other Ambulatory Visit: Payer: Self-pay

## 2023-06-25 ENCOUNTER — Emergency Department (HOSPITAL_COMMUNITY)

## 2023-06-25 DIAGNOSIS — K219 Gastro-esophageal reflux disease without esophagitis: Secondary | ICD-10-CM | POA: Diagnosis present

## 2023-06-25 DIAGNOSIS — K8050 Calculus of bile duct without cholangitis or cholecystitis without obstruction: Secondary | ICD-10-CM | POA: Diagnosis present

## 2023-06-25 DIAGNOSIS — R1011 Right upper quadrant pain: Secondary | ICD-10-CM | POA: Diagnosis present

## 2023-06-25 DIAGNOSIS — Z806 Family history of leukemia: Secondary | ICD-10-CM | POA: Diagnosis not present

## 2023-06-25 DIAGNOSIS — Z6837 Body mass index (BMI) 37.0-37.9, adult: Secondary | ICD-10-CM

## 2023-06-25 DIAGNOSIS — N183 Chronic kidney disease, stage 3 unspecified: Secondary | ICD-10-CM | POA: Diagnosis not present

## 2023-06-25 DIAGNOSIS — I129 Hypertensive chronic kidney disease with stage 1 through stage 4 chronic kidney disease, or unspecified chronic kidney disease: Secondary | ICD-10-CM | POA: Diagnosis present

## 2023-06-25 DIAGNOSIS — R932 Abnormal findings on diagnostic imaging of liver and biliary tract: Secondary | ICD-10-CM | POA: Diagnosis not present

## 2023-06-25 DIAGNOSIS — R109 Unspecified abdominal pain: Secondary | ICD-10-CM | POA: Diagnosis not present

## 2023-06-25 DIAGNOSIS — R7989 Other specified abnormal findings of blood chemistry: Secondary | ICD-10-CM | POA: Diagnosis not present

## 2023-06-25 DIAGNOSIS — K828 Other specified diseases of gallbladder: Secondary | ICD-10-CM | POA: Diagnosis not present

## 2023-06-25 DIAGNOSIS — N1831 Chronic kidney disease, stage 3a: Secondary | ICD-10-CM | POA: Diagnosis not present

## 2023-06-25 DIAGNOSIS — E66812 Obesity, class 2: Secondary | ICD-10-CM | POA: Diagnosis present

## 2023-06-25 DIAGNOSIS — Z820 Family history of epilepsy and other diseases of the nervous system: Secondary | ICD-10-CM

## 2023-06-25 DIAGNOSIS — R1013 Epigastric pain: Principal | ICD-10-CM

## 2023-06-25 DIAGNOSIS — K819 Cholecystitis, unspecified: Secondary | ICD-10-CM | POA: Diagnosis not present

## 2023-06-25 DIAGNOSIS — R748 Abnormal levels of other serum enzymes: Secondary | ICD-10-CM | POA: Diagnosis not present

## 2023-06-25 DIAGNOSIS — Z8249 Family history of ischemic heart disease and other diseases of the circulatory system: Secondary | ICD-10-CM | POA: Diagnosis not present

## 2023-06-25 DIAGNOSIS — K8010 Calculus of gallbladder with chronic cholecystitis without obstruction: Secondary | ICD-10-CM | POA: Diagnosis not present

## 2023-06-25 DIAGNOSIS — K8020 Calculus of gallbladder without cholecystitis without obstruction: Secondary | ICD-10-CM | POA: Diagnosis not present

## 2023-06-25 DIAGNOSIS — I10 Essential (primary) hypertension: Secondary | ICD-10-CM | POA: Diagnosis not present

## 2023-06-25 DIAGNOSIS — K810 Acute cholecystitis: Secondary | ICD-10-CM | POA: Diagnosis not present

## 2023-06-25 DIAGNOSIS — K5730 Diverticulosis of large intestine without perforation or abscess without bleeding: Secondary | ICD-10-CM | POA: Diagnosis not present

## 2023-06-25 DIAGNOSIS — K8070 Calculus of gallbladder and bile duct without cholecystitis without obstruction: Secondary | ICD-10-CM | POA: Diagnosis not present

## 2023-06-25 DIAGNOSIS — N200 Calculus of kidney: Secondary | ICD-10-CM | POA: Diagnosis not present

## 2023-06-25 LAB — COMPREHENSIVE METABOLIC PANEL
ALT: 313 U/L — ABNORMAL HIGH (ref 0–44)
AST: 543 U/L — ABNORMAL HIGH (ref 15–41)
Albumin: 4.1 g/dL (ref 3.5–5.0)
Alkaline Phosphatase: 58 U/L (ref 38–126)
Anion gap: 10 (ref 5–15)
BUN: 16 mg/dL (ref 6–20)
CO2: 25 mmol/L (ref 22–32)
Calcium: 9.8 mg/dL (ref 8.9–10.3)
Chloride: 101 mmol/L (ref 98–111)
Creatinine, Ser: 1.05 mg/dL — ABNORMAL HIGH (ref 0.44–1.00)
GFR, Estimated: >60 mL/min (ref >60)
Glucose, Bld: 141 mg/dL — ABNORMAL HIGH (ref 70–99)
Potassium: 4.1 mmol/L (ref 3.5–5.1)
Sodium: 136 mmol/L (ref 135–145)
Total Bilirubin: 1.5 mg/dL — ABNORMAL HIGH (ref 0.0–1.2)
Total Protein: 8.4 g/dL — ABNORMAL HIGH (ref 6.5–8.1)

## 2023-06-25 LAB — CBC WITH DIFFERENTIAL/PLATELET
Abs Immature Granulocytes: 0.03 10*3/uL (ref 0.00–0.07)
Basophils Absolute: 0.1 10*3/uL (ref 0.0–0.1)
Basophils Relative: 1 %
Eosinophils Absolute: 0.3 10*3/uL (ref 0.0–0.5)
Eosinophils Relative: 4 %
HCT: 42.6 % (ref 36.0–46.0)
Hemoglobin: 14.5 g/dL (ref 12.0–15.0)
Immature Granulocytes: 0 %
Lymphocytes Relative: 20 %
Lymphs Abs: 1.7 10*3/uL (ref 0.7–4.0)
MCH: 30.7 pg (ref 26.0–34.0)
MCHC: 34 g/dL (ref 30.0–36.0)
MCV: 90.1 fL (ref 80.0–100.0)
Monocytes Absolute: 0.7 10*3/uL (ref 0.1–1.0)
Monocytes Relative: 8 %
Neutro Abs: 5.9 10*3/uL (ref 1.7–7.7)
Neutrophils Relative %: 67 %
Platelets: 301 10*3/uL (ref 150–400)
RBC: 4.73 MIL/uL (ref 3.87–5.11)
RDW: 12.7 % (ref 11.5–15.5)
WBC: 8.7 10*3/uL (ref 4.0–10.5)
nRBC: 0 % (ref 0.0–0.2)

## 2023-06-25 LAB — PROTIME-INR
INR: 0.9 (ref 0.8–1.2)
Prothrombin Time: 12.6 s (ref 11.4–15.2)

## 2023-06-25 LAB — LIPASE, BLOOD: Lipase: 30 U/L (ref 11–51)

## 2023-06-25 MED ORDER — IOHEXOL 300 MG/ML  SOLN
100.0000 mL | Freq: Once | INTRAMUSCULAR | Status: AC | PRN
  Administered 2023-06-25: 100 mL via INTRAVENOUS

## 2023-06-25 NOTE — ED Triage Notes (Signed)
 Pt to ED POV from home with c/o abd pain that started this afternoon, pt says she has been having episodes of abd pain over the past 12 months, this is the 3rd episode and hasn't went away all day. Pt also reports nausea.

## 2023-06-25 NOTE — ED Provider Notes (Signed)
 [facility]-EMERGENCY DEPT [facility] Emergency Department Provider Note MRN:  [id number]  Arrival date & time: 06/26/23     Chief Complaint   Abdominal Pain   History of Present Illness   Tracy Mcgee is a 58 y.o. year-old female with a history of hypertension presenting to the ED with chief complaint of abdominal pain.  Severe epigastric abdominal pain about 2 hours after lunch today, would not go away.  Has had 2 other similar episodes over the past year but they were more mild and of less duration of time.  Pain has improved since arriving here in the emergency department.  Had nausea and 2 episodes of vomiting during the severe pain, no diarrhea.  No chest pain or shortness of breath.  Review of Systems  A thorough review of systems was obtained and all systems are negative except as noted in the HPI and PMH.   Patient's Health History    Past Medical History:  Diagnosis Date   Arthritis    CKD (chronic kidney disease) stage 3, GFR 30-59 ml/min (HCC)    Hypertension     No past surgical history on file.  Family History  Problem Relation Age of Onset   Colitis Mother    Heart disease Father    Heart disease Brother    Crohn's disease Son    Alzheimer's disease Maternal Grandmother    Leukemia Paternal Grandmother    Heart disease Paternal Grandfather     Social History   Socioeconomic History   Marital status: Married    Spouse name: Not on file   Number of children: Not on file   Years of education: Not on file   Highest education level: Associate degree: occupational, Scientist, product/process development, or vocational program  Occupational History   Not on file  Tobacco Use   Smoking status: Never   Smokeless tobacco: Never  Substance and Sexual Activity   Alcohol use: No   Drug use: No   Sexual activity: Yes  Other Topics Concern   Not on file  Social History Narrative   Not on file   Social Drivers of Health   Financial Resource Strain: Low Risk  (04/03/2023)   Overall  Financial Resource Strain (CARDIA)    Difficulty of Paying Living Expenses: Not hard at all  Food Insecurity: No Food Insecurity (04/03/2023)   Hunger Vital Sign    Worried About Running Out of Food in the Last Year: Never true    Ran Out of Food in the Last Year: Never true  Transportation Needs: No Transportation Needs (04/03/2023)   PRAPARE - Administrator, Civil Service (Medical): No    Lack of Transportation (Non-Medical): No  Physical Activity: Insufficiently Active (04/03/2023)   Exercise Vital Sign    Days of Exercise per Week: 1 day    Minutes of Exercise per Session: 10 min  Stress: No Stress Concern Present (04/03/2023)   [employer] of Occupational Health - Occupational Stress Questionnaire    Feeling of Stress : Not at all  Social Connections: Socially Integrated (04/03/2023)   Social Connection and Isolation Panel [NHANES]    Frequency of Communication with Friends and Family: Twice a week    Frequency of Social Gatherings with Friends and Family: Once a week    Attends Religious Services: More than 4 times per year    Active Member of [employer] or Organizations: Yes    Attends Banker Meetings: More than 4 times per year  Marital Status: Married  Catering manager Violence: Not on file     Physical Exam   Vitals:   06/26/23 0600 06/26/23 0630  BP: 133/87 123/76  Pulse: 62 66  Resp: 14 12  Temp:    SpO2: 94% 94%    CONSTITUTIONAL: Well-appearing, NAD NEURO/PSYCH:  Alert and oriented x 3, no focal deficits EYES:  eyes equal and reactive ENT/NECK:  no LAD, no JVD CARDIO: Regular rate, well-perfused, normal S1 and S2 PULM:  CTAB no wheezing or rhonchi GI/GU:  non-distended, non-tender MSK/SPINE:  No gross deformities, no edema SKIN:  no rash, atraumatic   *Additional and/or pertinent findings included in MDM below  Diagnostic and Interventional Summary    EKG Interpretation Date/Time:  Tuesday June 25 2023 23:44:26  EDT Ventricular Rate:  82 PR Interval:  159 QRS Duration:  91 QT Interval:  389 QTC Calculation: 455 R Axis:   -1  Text Interpretation: Sinus rhythm Low voltage, precordial leads Confirmed by Kennis Carina [phone]) on 06/25/2023 11:54:34 PM       Labs Reviewed  COMPREHENSIVE METABOLIC PANEL - Abnormal; Notable for the following components:      Result Value   Glucose, Bld 141 (*)    Creatinine, Ser 1.05 (*)    Total Protein 8.4 (*)    AST 543 (*)    ALT 313 (*)    Total Bilirubin 1.5 (*)    All other components within normal limits  COMPREHENSIVE METABOLIC PANEL - Abnormal; Notable for the following components:   Glucose, Bld 108 (*)    AST 707 (*)    ALT 553 (*)    Total Bilirubin 2.0 (*)    All other components within normal limits  CBC WITH DIFFERENTIAL/PLATELET  LIPASE, BLOOD  PROTIME-INR  CBC  URINALYSIS, ROUTINE W REFLEX MICROSCOPIC  HEPATITIS PANEL, ACUTE  HIV ANTIBODY (ROUTINE TESTING W REFLEX)    CT ABDOMEN PELVIS W CONTRAST  Final Result    US Abdomen Limited RUQ (LIVER/GB)    (Results Pending)    Medications  irbesartan (AVAPRO) tablet 75 mg (has no administration in time range)  multivitamin with minerals tablet 1 tablet (has no administration in time range)  enoxaparin (LOVENOX) injection 40 mg (has no administration in time range)  0.9 %  sodium chloride infusion ( Intravenous New Bag/Given 06/26/23 0310)  acetaminophen (TYLENOL) tablet 650 mg (has no administration in time range)    Or  acetaminophen (TYLENOL) suppository 650 mg (has no administration in time range)  traZODone (DESYREL) tablet 25 mg (has no administration in time range)  magnesium hydroxide (MILK OF MAGNESIA) suspension 30 mL (has no administration in time range)  ondansetron (ZOFRAN) tablet 4 mg (has no administration in time range)    Or  ondansetron (ZOFRAN) injection 4 mg (has no administration in time range)  morphine (PF) 2 MG/ML injection 2 mg (has no administration in time  range)  iohexol (OMNIPAQUE) 300 MG/ML solution 100 mL (100 mLs Intravenous Contrast Given 06/25/23 2329)  morphine (PF) 4 MG/ML injection 4 mg (4 mg Intravenous Given 06/26/23 0050)     Procedures  /  Critical Care Procedures  ED Course and Medical Decision Making  Initial Impression and Ddx Differential diagnosis includes gastritis, pancreatitis, biliary colic, cholecystitis.  Abdomen is currently soft and nontender, pain is mild at this time.  Vital signs are normal.  Labs reveal prominently elevated LFTs raising further concern for choledocholithiasis.  No signs of cholangitis at this time.  Will obtain CT imaging  and consider admission for ERCP or MRCP especially if symptoms return.  Past medical/surgical history that increases complexity of ED encounter: None  Interpretation of Diagnostics I personally reviewed the Laboratory Testing and my interpretation is as follows: LFT elevation  CT with biliary sludge  Patient Reassessment and Ultimate Disposition/Management     Case discussed with Dr. Henreitta Leber of general surgery, plan is for hospitalist admission for trending of LFTs, ultrasound.  Patient management required discussion with the following services or consulting groups:  Hospitalist Service and General/Trauma Surgery  Complexity of Problems Addressed Acute illness or injury that poses threat of life of bodily function  Additional Data Reviewed and Analyzed Further history obtained from: Further history from spouse/family member  Additional Factors Impacting ED Encounter Risk Consideration of hospitalization  Elmer Sow. Pilar Plate, MD [facility] Emergency Medicine [facility] [email or web address]  Final Clinical Impressions(s) / ED Diagnoses     ICD-10-CM   1. Epigastric pain  R10.13       ED Discharge Orders     None        Discharge Instructions Discussed with and Provided to Patient:   Discharge Instructions   None      Sabas Sous,  MD 06/26/23 [phone]

## 2023-06-26 ENCOUNTER — Encounter (HOSPITAL_COMMUNITY): Payer: Self-pay | Admitting: Family Medicine

## 2023-06-26 ENCOUNTER — Encounter (HOSPITAL_COMMUNITY): Payer: Self-pay

## 2023-06-26 ENCOUNTER — Inpatient Hospital Stay (HOSPITAL_COMMUNITY)

## 2023-06-26 DIAGNOSIS — I129 Hypertensive chronic kidney disease with stage 1 through stage 4 chronic kidney disease, or unspecified chronic kidney disease: Secondary | ICD-10-CM | POA: Diagnosis present

## 2023-06-26 DIAGNOSIS — R1011 Right upper quadrant pain: Secondary | ICD-10-CM | POA: Diagnosis present

## 2023-06-26 DIAGNOSIS — I10 Essential (primary) hypertension: Secondary | ICD-10-CM | POA: Diagnosis present

## 2023-06-26 DIAGNOSIS — Z8249 Family history of ischemic heart disease and other diseases of the circulatory system: Secondary | ICD-10-CM | POA: Diagnosis not present

## 2023-06-26 DIAGNOSIS — K8020 Calculus of gallbladder without cholecystitis without obstruction: Secondary | ICD-10-CM | POA: Diagnosis not present

## 2023-06-26 DIAGNOSIS — K219 Gastro-esophageal reflux disease without esophagitis: Secondary | ICD-10-CM | POA: Diagnosis present

## 2023-06-26 DIAGNOSIS — R1013 Epigastric pain: Secondary | ICD-10-CM | POA: Diagnosis present

## 2023-06-26 DIAGNOSIS — R7989 Other specified abnormal findings of blood chemistry: Secondary | ICD-10-CM | POA: Diagnosis not present

## 2023-06-26 DIAGNOSIS — K8070 Calculus of gallbladder and bile duct without cholecystitis without obstruction: Secondary | ICD-10-CM | POA: Diagnosis present

## 2023-06-26 DIAGNOSIS — K828 Other specified diseases of gallbladder: Secondary | ICD-10-CM | POA: Diagnosis present

## 2023-06-26 DIAGNOSIS — K8050 Calculus of bile duct without cholangitis or cholecystitis without obstruction: Secondary | ICD-10-CM | POA: Diagnosis present

## 2023-06-26 DIAGNOSIS — Z820 Family history of epilepsy and other diseases of the nervous system: Secondary | ICD-10-CM | POA: Diagnosis not present

## 2023-06-26 DIAGNOSIS — Z806 Family history of leukemia: Secondary | ICD-10-CM | POA: Diagnosis not present

## 2023-06-26 DIAGNOSIS — E66812 Obesity, class 2: Secondary | ICD-10-CM | POA: Diagnosis present

## 2023-06-26 DIAGNOSIS — R748 Abnormal levels of other serum enzymes: Secondary | ICD-10-CM | POA: Diagnosis present

## 2023-06-26 DIAGNOSIS — Z6837 Body mass index (BMI) 37.0-37.9, adult: Secondary | ICD-10-CM | POA: Diagnosis not present

## 2023-06-26 DIAGNOSIS — N183 Chronic kidney disease, stage 3 unspecified: Secondary | ICD-10-CM | POA: Diagnosis present

## 2023-06-26 LAB — CBC
HCT: 42.3 % (ref 36.0–46.0)
Hemoglobin: 14.1 g/dL (ref 12.0–15.0)
MCH: 30.4 pg (ref 26.0–34.0)
MCHC: 33.3 g/dL (ref 30.0–36.0)
MCV: 91.2 fL (ref 80.0–100.0)
Platelets: 301 10*3/uL (ref 150–400)
RBC: 4.64 MIL/uL (ref 3.87–5.11)
RDW: 13 % (ref 11.5–15.5)
WBC: 7 10*3/uL (ref 4.0–10.5)
nRBC: 0 % (ref 0.0–0.2)

## 2023-06-26 LAB — COMPREHENSIVE METABOLIC PANEL
ALT: 553 U/L — ABNORMAL HIGH (ref 0–44)
AST: 707 U/L — ABNORMAL HIGH (ref 15–41)
Albumin: 3.9 g/dL (ref 3.5–5.0)
Alkaline Phosphatase: 62 U/L (ref 38–126)
Anion gap: 9 (ref 5–15)
BUN: 14 mg/dL (ref 6–20)
CO2: 26 mmol/L (ref 22–32)
Calcium: 9.9 mg/dL (ref 8.9–10.3)
Chloride: 103 mmol/L (ref 98–111)
Creatinine, Ser: 1 mg/dL (ref 0.44–1.00)
GFR, Estimated: >60 mL/min (ref >60)
Glucose, Bld: 108 mg/dL — ABNORMAL HIGH (ref 70–99)
Potassium: 4.5 mmol/L (ref 3.5–5.1)
Sodium: 138 mmol/L (ref 135–145)
Total Bilirubin: 2 mg/dL — ABNORMAL HIGH (ref 0.0–1.2)
Total Protein: 7.8 g/dL (ref 6.5–8.1)

## 2023-06-26 LAB — HEPATITIS PANEL, ACUTE
HCV Ab: NONREACTIVE
Hep A IgM: NONREACTIVE
Hep B C IgM: NONREACTIVE
Hepatitis B Surface Ag: NONREACTIVE

## 2023-06-26 LAB — HIV ANTIBODY (ROUTINE TESTING W REFLEX): HIV Screen 4th Generation wRfx: NONREACTIVE

## 2023-06-26 MED ORDER — ACETAMINOPHEN 325 MG PO TABS: 650.0000 mg | ORAL_TABLET | Freq: Four times a day (QID) | ORAL | Status: DC | PRN

## 2023-06-26 MED ORDER — MORPHINE SULFATE (PF) 2 MG/ML IV SOLN: 2.0000 mg | INTRAVENOUS | Status: DC | PRN

## 2023-06-26 MED ORDER — GADOBUTROL 1 MMOL/ML IV SOLN
10.0000 mL | Freq: Once | INTRAVENOUS | Status: AC | PRN
  Administered 2023-06-26: 10 mL via INTRAVENOUS

## 2023-06-26 MED ORDER — IRBESARTAN 75 MG PO TABS
75.0000 mg | ORAL_TABLET | Freq: Every day | ORAL | Status: DC
  Administered 2023-06-26 – 2023-06-29 (×3): 75 mg via ORAL
  Filled 2023-06-26 (×5): qty 1

## 2023-06-26 MED ORDER — ADULT MULTIVITAMIN W/MINERALS CH
1.0000 | ORAL_TABLET | Freq: Every day | ORAL | Status: DC
  Administered 2023-06-26 – 2023-06-29 (×2): 1 via ORAL
  Filled 2023-06-26 (×3): qty 1

## 2023-06-26 MED ORDER — ACETAMINOPHEN 650 MG RE SUPP: 650.0000 mg | Freq: Four times a day (QID) | RECTAL | Status: DC | PRN

## 2023-06-26 MED ORDER — ONDANSETRON HCL 4 MG PO TABS
4.0000 mg | ORAL_TABLET | Freq: Four times a day (QID) | ORAL | Status: DC | PRN
  Administered 2023-06-27: 4 mg via ORAL
  Filled 2023-06-26: qty 1

## 2023-06-26 MED ORDER — ENOXAPARIN SODIUM 40 MG/0.4ML IJ SOSY
40.0000 mg | PREFILLED_SYRINGE | INTRAMUSCULAR | Status: DC
  Administered 2023-06-26 – 2023-06-29 (×2): 40 mg via SUBCUTANEOUS
  Filled 2023-06-26 (×3): qty 0.4

## 2023-06-26 MED ORDER — MORPHINE SULFATE (PF) 4 MG/ML IV SOLN
4.0000 mg | Freq: Once | INTRAVENOUS | Status: AC
  Administered 2023-06-26: 4 mg via INTRAVENOUS
  Filled 2023-06-26: qty 1

## 2023-06-26 MED ORDER — TRAZODONE HCL 50 MG PO TABS: 25.0000 mg | ORAL_TABLET | Freq: Every evening | ORAL | Status: DC | PRN

## 2023-06-26 MED ORDER — SODIUM CHLORIDE 0.9 % IV SOLN: INTRAVENOUS | Status: AC

## 2023-06-26 MED ORDER — MAGNESIUM HYDROXIDE 400 MG/5ML PO SUSP: 30.0000 mL | Freq: Every day | ORAL | Status: DC | PRN

## 2023-06-26 MED ORDER — ONDANSETRON HCL 4 MG/2ML IJ SOLN: 4.0000 mg | Freq: Four times a day (QID) | INTRAMUSCULAR | Status: DC | PRN

## 2023-06-26 NOTE — TOC CM/SW Note (Signed)
 Transition of Care [facility]) - Inpatient Brief Assessment   Patient Details  Name: Tracy Mcgee MRN: [id number] Date of Birth: 07-14-1965  Transition of Care [facility]) CM/SW Contact:    Isabella Bowens, LCSWA Phone Number: 06/26/2023, 8:13 AM   Clinical Narrative:  Transition of Care Department [facility]) has reviewed patient and no TOC needs have been identified at this time. We will continue to monitor patient advancement through interdisciplinary progression rounds. If new patient transition needs arise, please place a TOC consult.  Transition of Care Asessment: Insurance and Status: Insurance coverage has been reviewed Patient has primary care physician: Yes Home environment has been reviewed: Single Family Home with spouse Prior level of function:: Independent Prior/Current Home Services: No current home services Social Drivers of Health Review: SDOH reviewed no interventions necessary Readmission risk has been reviewed: Yes Transition of care needs: no transition of care needs at this time

## 2023-06-26 NOTE — ED Notes (Signed)
 ED Provider at bedside.

## 2023-06-26 NOTE — Assessment & Plan Note (Addendum)
-   This is associated with cholelithiasis with gallbladder sludge, rule out acute cholecystitis. - The patient be admitted to a medical-surgical bed. - We will obtain a right upper quadrant ultrasound in a.m. - Hepatitis panel is currently pending. - General Surgery consult will be obtained. - I notified Dr. Henreitta Leber about the patient with a timed message.

## 2023-06-26 NOTE — Consult Note (Addendum)
 I was present with the medical student for this service. I personally verified the history of present illness, performed the physical exam, and made the plan for this encounter. I have verified the medical student's documentation and made modifications where appropriately. I have personally documented in my own words a brief history, physical, and plan below.     Stones likely on [place]. LFTs elevated and normal Alk phos, bilirubin going up. Unusual LFT pattern for acute cholecystitis alone.    MRCP recommended. MRCP with stone in mid CBD  Algis Greenhouse, MD [facility] [address] [place]ella [place], [place] [address] [phone] (office)   [facility] Consult  Reason for Consult: Possible acute cholecystitis  Referring Physician: Dr. Arville Care   Chief Complaint   Abdominal Pain     HPI: DELBERTA Mcgee is a 58 y.o. female with PMH of CKD stage III, and arthritis presenting with diffuse abdominal pain.  The pain started yesterday afternoon around 2:00 pm and has had a duration of 12 hours until she came to the ED last night. This is the third episode in the last 12 months which has lasted longer than usual. Previous episodes were similar and she had [place] food each time this happened. It is associated with n/v, acid reflux, decreased appetite and chills. Patient also endorsed some loose stools but no diarrhea, bloody or mucus in stool. Patient denied having any fevers, dysuria, SOB or any chest pain.  The pain was constant until she received care in the ED. She did not try any medications for the pain.    Past Medical History:  Diagnosis Date   Arthritis    CKD (chronic kidney disease) stage 3, GFR 30-59 ml/min (HCC)    Hypertension     History reviewed. No pertinent surgical history.  Family History  Problem Relation Age of Onset   Colitis Mother    Heart disease Father    Heart disease Brother    Crohn's disease Son    Alzheimer's  disease Maternal Grandmother    Leukemia Paternal Grandmother    Heart disease Paternal Grandfather     Social History   Tobacco Use   Smoking status: Never   Smokeless tobacco: Never  Substance Use Topics   Alcohol use: No   Drug use: No    Medications: I have reviewed the patient's current medications. Currently taking Valsartan and multivitamins.  Current Facility-Administered Medications  Medication Dose Route Frequency Provider Last Rate Last Admin   0.9 %  sodium chloride infusion   Intravenous Continuous Mansy, Jan A, MD 100 mL/hr at 06/26/23 0310 New Bag at 06/26/23 0310   acetaminophen (TYLENOL) tablet 650 mg  650 mg Oral Q6H PRN Mansy, Jan A, MD       Or   acetaminophen (TYLENOL) suppository 650 mg  650 mg Rectal Q6H PRN Mansy, Jan A, MD       enoxaparin (LOVENOX) injection 40 mg  40 mg Subcutaneous Q24H Mansy, Jan A, MD   40 mg at 06/26/23 4132   irbesartan (AVAPRO) tablet 75 mg  75 mg Oral Daily Mansy, Jan A, MD   75 mg at 06/26/23 0851   magnesium hydroxide (MILK OF MAGNESIA) suspension 30 mL  30 mL Oral Daily PRN Mansy, Jan A, MD       morphine (PF) 2 MG/ML injection 2 mg  2 mg Intravenous Q4H PRN Mansy, Jan A, MD       multivitamin with minerals tablet 1 tablet  1  tablet Oral Daily Mansy, Jan A, MD   1 tablet at 06/26/23 0851   ondansetron [facility]) tablet 4 mg  4 mg Oral Q6H PRN Mansy, Jan A, MD       Or   ondansetron [facility] [place] [facility]) injection 4 mg  4 mg Intravenous Q6H PRN Mansy, Jan A, MD       traZODone (DESYREL) tablet 25 mg  25 mg Oral QHS PRN Mansy, Vernetta Honey, MD       Current Outpatient Medications  Medication Sig Dispense Refill Last Dose/Taking   Multiple Vitamins-Minerals (MULTIVITAMIN WITH MINERALS) tablet Take 1 tablet by mouth daily.   Past Week   valsartan (DIOVAN) 80 MG tablet Take 1 tablet (80 mg total) by mouth daily. 90 tablet 1 Past Week    No Known Allergies   ROS:  Review of Systems  Constitutional: Negative.  Negative for chills and fever.   Respiratory:  Negative for cough and shortness of breath.   Cardiovascular:  Negative for chest pain.  Gastrointestinal:  Negative for abdominal pain, blood in stool, diarrhea, melena, nausea and vomiting.  Genitourinary:  Negative for dysuria, hematuria and urgency.  Neurological:  Negative for dizziness and headaches.    Blood pressure (!) 140/88, pulse 65, temperature 97.9 F (36.6 C), resp. rate 12, height 5\' 6"  (1.676 m), weight 106.6 kg, last menstrual period 04/20/2012, SpO2 95%. Physical Exam Vitals reviewed.  Abdominal:     Palpations: Abdomen is soft.  Skin:    General: Skin is warm.  Neurological:     General: No focal deficit present.     Mental Status: She is alert and oriented to person, place, and time.    General: appear comfortable, no acute distress Lungs: CTAB, no wheezing, rubs or gallops Cardiovascular: normal rate and rhythm. Normal S1 and S2. No m/r/g.  Abdomen: Normal bowel sounds. Minor RUQ tenderness, no rebound or guarding. No peritoneal signs. No hepatosplenomegaly.  Extremities: no edema, or cyanosis.   Results: Results for orders placed or performed during the hospital encounter of 06/25/23 (from the past 48 hours)  CBC with Differential     Status: None   Collection Time: 06/25/23 10:32 PM  Result Value Ref Range   WBC 8.7 4.0 - 10.5 K/uL   RBC 4.73 3.87 - 5.11 MIL/uL   Hemoglobin 14.5 12.0 - 15.0 g/dL   HCT 16.1 09.6 - 04.5 %   MCV 90.1 80.0 - 100.0 fL   MCH 30.7 26.0 - 34.0 pg   MCHC 34.0 30.0 - 36.0 g/dL   RDW 40.9 81.1 - 91.4 %   Platelets 301 150 - 400 K/uL   nRBC 0.0 0.0 - 0.2 %   Neutrophils Relative % 67 %   Neutro Abs 5.9 1.7 - 7.7 K/uL   Lymphocytes Relative 20 %   Lymphs Abs 1.7 0.7 - 4.0 K/uL   Monocytes Relative 8 %   Monocytes Absolute 0.7 0.1 - 1.0 K/uL   Eosinophils Relative 4 %   Eosinophils Absolute 0.3 0.0 - 0.5 K/uL   Basophils Relative 1 %   Basophils Absolute 0.1 0.0 - 0.1 K/uL   Immature Granulocytes 0 %   Abs  Immature Granulocytes 0.03 0.00 - 0.07 K/uL    Comment: Performed at [facility], [address], [place], [place] [address]  Comprehensive metabolic panel     Status: Abnormal   Collection Time: 06/25/23 10:32 PM  Result Value Ref Range   Sodium 136 135 - 145 mmol/L   Potassium 4.1 3.5 -  5.1 mmol/L   Chloride 101 98 - 111 mmol/L   CO2 25 22 - 32 mmol/L   Glucose, Bld 141 (H) 70 - 99 mg/dL    Comment: Glucose reference range applies only to samples taken after fasting for at least 8 hours.   BUN 16 6 - 20 mg/dL   Creatinine, Ser 6.21 (H) 0.44 - 1.00 mg/dL   Calcium 9.8 8.9 - 30.8 mg/dL   Total Protein 8.4 (H) 6.5 - 8.1 g/dL   Albumin 4.1 3.5 - 5.0 g/dL   AST 657 (H) 15 - 41 U/L   ALT 313 (H) 0 - 44 U/L   Alkaline Phosphatase 58 38 - 126 U/L   Total Bilirubin 1.5 (H) 0.0 - 1.2 mg/dL   GFR, Estimated >84 >69 mL/min    Comment: (NOTE) Calculated using the CKD-EPI Creatinine Equation (2021)    Anion gap 10 5 - 15    Comment: Performed at [facility], [address], [place], [place] [address]  Lipase, blood     Status: None   Collection Time: 06/25/23 10:32 PM  Result Value Ref Range   Lipase 30 11 - 51 U/L    Comment: Performed at [facility], [address], [place], [place] [address]  Protime-INR     Status: None   Collection Time: 06/25/23 10:32 PM  Result Value Ref Range   Prothrombin Time 12.6 11.4 - 15.2 seconds   INR 0.9 0.8 - 1.2    Comment: (NOTE) INR goal varies based on device and disease states. Performed at [facility], [address] [place], [place] [address]   Hepatitis panel, acute     Status: None   Collection Time: 06/25/23 11:48 PM  Result Value Ref Range   Hepatitis B Surface Ag NON REACTIVE NON REACTIVE   HCV Ab NON REACTIVE NON REACTIVE    Comment: (NOTE) Nonreactive HCV antibody screen is consistent with no HCV infections,  unless recent infection is suspected or other evidence exists to indicate HCV infection.     Hep A IgM NON REACTIVE NON  REACTIVE   Hep B C IgM NON REACTIVE NON REACTIVE    Comment: Performed at [facility], [address], [place], [place] [address]  HIV Antibody (routine testing w rflx)     Status: None   Collection Time: 06/25/23 11:48 PM  Result Value Ref Range   HIV Screen 4th Generation wRfx Non Reactive Non Reactive    Comment: Performed at [facility], [address] [place], [place] [address]  Comprehensive metabolic panel     Status: Abnormal   Collection Time: 06/26/23  5:29 AM  Result Value Ref Range   Sodium 138 135 - 145 mmol/L   Potassium 4.5 3.5 - 5.1 mmol/L   Chloride 103 98 - 111 mmol/L   CO2 26 22 - 32 mmol/L   Glucose, Bld 108 (H) 70 - 99 mg/dL    Comment: Glucose reference range applies only to samples taken after fasting for at least 8 hours.   BUN 14 6 - 20 mg/dL   Creatinine, Ser 3.47 0.44 - 1.00 mg/dL   Calcium 9.9 8.9 - 42.5 mg/dL   Total Protein 7.8 6.5 - 8.1 g/dL   Albumin 3.9 3.5 - 5.0 g/dL   AST 956 (H) 15 - 41 U/L   ALT 553 (H) 0 - 44 U/L   Alkaline Phosphatase 62 38 - 126 U/L   Total Bilirubin 2.0 (H) 0.0 - 1.2  mg/dL   GFR, Estimated >16 >10 mL/min    Comment: (NOTE) Calculated using the CKD-EPI Creatinine Equation (2021)    Anion gap 9 5 - 15    Comment: Performed at [facility], [address], [place], [place] [address]  CBC     Status: None   Collection Time: 06/26/23  5:29 AM  Result Value Ref Range   WBC 7.0 4.0 - 10.5 K/uL   RBC 4.64 3.87 - 5.11 MIL/uL   Hemoglobin 14.1 12.0 - 15.0 g/dL   HCT 40.9 81.1 - 91.4 %   MCV 91.2 80.0 - 100.0 fL   MCH 30.4 26.0 - 34.0 pg   MCHC 33.3 30.0 - 36.0 g/dL   RDW 78.2 95.6 - 21.3 %   Platelets 301 150 - 400 K/uL   nRBC 0.0 0.0 - 0.2 %    Comment: Performed at [facility], [address] [place], [place] [address]   Personally reviewed imaging- Ct with some distention gallbladder possible wall thickening, [place] with possible gallbladder filled with stones, MRCP with stone in midduct  MR ABDOMEN MRCP W  WO CONTAST Result Date: 06/26/2023 CLINICAL DATA:  Elevated liver enzymes, right upper quadrant pain EXAM: MRI ABDOMEN WITHOUT AND WITH CONTRAST (INCLUDING MRCP) TECHNIQUE: Multiplanar multisequence MR imaging of the abdomen was performed both before and after the administration of intravenous contrast. Heavily T2-weighted images of the biliary and pancreatic ducts were obtained, and three-dimensional MRCP images were rendered by post processing. CONTRAST:  10mL GADAVIST GADOBUTROL 1 MMOL/ML IV SOLN COMPARISON:  CT abdomen pelvis, 06/25/2023 FINDINGS: Lower chest: No acute abnormality. Hepatobiliary: No solid liver abnormality is seen. Gallbladder is distended by innumerable tiny calculi (series 4, image 22). No gallbladder wall thickening. At least one small gallstone in the central common bile duct near the ampulla measuring 0.4 cm (series 4, image 23), possibly additional tiny calculi or gravel. Common bile duct is nondistended. There may be minimal intrahepatic biliary ductal dilatation. Pancreas: Unremarkable. No pancreatic ductal dilatation or surrounding inflammatory changes. Spleen: Normal in size without significant abnormality. Adrenals/Urinary Tract: Adrenal glands are unremarkable. Kidneys are normal, without renal calculi, solid lesion, or hydronephrosis. Stomach/Bowel: Stomach is within normal limits. No evidence of bowel wall thickening, distention, or inflammatory changes. Vascular/Lymphatic: No significant vascular findings are present. No enlarged abdominal lymph nodes. Other: No abdominal wall hernia or abnormality. No ascites. Musculoskeletal: No acute or significant osseous findings. IMPRESSION: 1. Cholelithiasis without evidence of acute cholecystitis; gallbladder is distended by innumerable tiny calculi without gallbladder wall thickening or pericholecystic fluid. 2. Choledocholithiasis; at least one small gallstone in the central common bile duct near the ampulla measuring 0.4 cm, possibly  additional tiny calculi or gravel. Common bile duct is nondistended. There may be minimal intrahepatic biliary ductal dilatation. Electronically Signed   By: Jearld Lesch M.D.   On: 06/26/2023 14:15   MR 3D Recon At Scanner Result Date: 06/26/2023 CLINICAL DATA:  Elevated liver enzymes, right upper quadrant pain EXAM: MRI ABDOMEN WITHOUT AND WITH CONTRAST (INCLUDING MRCP) TECHNIQUE: Multiplanar multisequence MR imaging of the abdomen was performed both before and after the administration of intravenous contrast. Heavily T2-weighted images of the biliary and pancreatic ducts were obtained, and three-dimensional MRCP images were rendered by post processing. CONTRAST:  10mL GADAVIST GADOBUTROL 1 MMOL/ML IV SOLN COMPARISON:  CT abdomen pelvis, 06/25/2023 FINDINGS: Lower chest: No acute abnormality. Hepatobiliary: No solid liver abnormality is seen. Gallbladder is distended by innumerable tiny calculi (series 4, image 22). No gallbladder wall thickening.  At least one small gallstone in the central common bile duct near the ampulla measuring 0.4 cm (series 4, image 23), possibly additional tiny calculi or gravel. Common bile duct is nondistended. There may be minimal intrahepatic biliary ductal dilatation. Pancreas: Unremarkable. No pancreatic ductal dilatation or surrounding inflammatory changes. Spleen: Normal in size without significant abnormality. Adrenals/Urinary Tract: Adrenal glands are unremarkable. Kidneys are normal, without renal calculi, solid lesion, or hydronephrosis. Stomach/Bowel: Stomach is within normal limits. No evidence of bowel wall thickening, distention, or inflammatory changes. Vascular/Lymphatic: No significant vascular findings are present. No enlarged abdominal lymph nodes. Other: No abdominal wall hernia or abnormality. No ascites. Musculoskeletal: No acute or significant osseous findings. IMPRESSION: 1. Cholelithiasis without evidence of acute cholecystitis; gallbladder is distended by  innumerable tiny calculi without gallbladder wall thickening or pericholecystic fluid. 2. Choledocholithiasis; at least one small gallstone in the central common bile duct near the ampulla measuring 0.4 cm, possibly additional tiny calculi or gravel. Common bile duct is nondistended. There may be minimal intrahepatic biliary ductal dilatation. Electronically Signed   By: Jearld Lesch M.D.   On: 06/26/2023 14:15   US Abdomen Limited RUQ (LIVER/GB) Result Date: 06/26/2023 CLINICAL DATA:  Right upper quadrant abdominal pain EXAM: ULTRASOUND ABDOMEN LIMITED RIGHT UPPER QUADRANT COMPARISON:  CT 06/25/2023 FINDINGS: Gallbladder: Dilated gallbladder. Slight gallbladder wall thickening of 5 mm. Echogenic appearing wall with shadowed areas of the gallbladder. This could be a gallbladder full of stones but not classic in appearance. Common bile duct: Diameter: 4 mm Liver: No focal lesion identified. Within normal limits in parenchymal echogenicity. Portal vein is patent on color Doppler imaging with normal direction of blood flow towards the liver. Other: Evaluation limited with overlapping bowel gas and soft tissue. IMPRESSION: Dilated gallbladder with question of a gallbladder full of stones but there overall limitations and the appearance is not classic. Recommend further workup with MRCP to further delineate. No ductal dilatation. Electronically Signed   By: Karen Kays M.D.   On: 06/26/2023 10:21   CT ABDOMEN PELVIS W CONTRAST Result Date: 06/26/2023 CLINICAL DATA:  Intermittent abdominal pain and nausea. EXAM: CT ABDOMEN AND PELVIS WITH CONTRAST TECHNIQUE: Multidetector CT imaging of the abdomen and pelvis was performed using the standard protocol following bolus administration of intravenous contrast. RADIATION DOSE REDUCTION: This exam was performed according to the departmental dose-optimization program which includes automated exposure control, adjustment of the mA and/or kV according to patient size and/or  use of iterative reconstruction technique. CONTRAST:  OMNIPAQUE IOHEXOL 300 MG/ML  SOLN COMPARISON:  None Available. FINDINGS: Lower chest: No acute abnormality. Hepatobiliary: No focal liver abnormality is seen. A moderate amount of sludge is suspected within a distended gallbladder, without evidence of gallstones, gallbladder wall thickening, or biliary dilatation. Pancreas: Unremarkable. No pancreatic ductal dilatation or surrounding inflammatory changes. Spleen: Normal in size without focal abnormality. Adrenals/Urinary Tract: Adrenal glands are unremarkable. Kidneys are normal, without obstructing renal calculi, focal lesion, or hydronephrosis. A 3 mm nonobstructing renal calculus is seen within the upper pole of the right kidney. Bladder is unremarkable. Stomach/Bowel: Stomach is within normal limits. Appendix appears normal. No evidence of bowel wall thickening, distention, or inflammatory changes. Noninflamed diverticula are seen throughout the sigmoid colon Vascular/Lymphatic: No significant vascular findings are present. No enlarged abdominal or pelvic lymph nodes. Reproductive: Uterus and bilateral adnexa are unremarkable. Other: No abdominal wall hernia or abnormality. No abdominopelvic ascites. Musculoskeletal: No acute or significant osseous findings. IMPRESSION: 1. Moderate amount of sludge within a distended gallbladder.  Correlation with nonemergent right upper quadrant ultrasound is recommended. 2. 3 mm nonobstructing right renal calculus. 3. Sigmoid diverticulosis. Electronically Signed   By: Aram Candela M.D.   On: 06/26/2023 00:24     Assessment & Plan:  NITARA SZCZERBA is a 58 y.o. female with PMH of CKD stage III, and arthritis initially presenting to the ED with diffuse abdominal pain and increase transaminases (AST of 707, ALT of 553, total bili 2.0). Patient described having diffuse abdominal pain in band like pattern and non-bilious n/v. Denied diarrhea, and fever. Her CBC  with diff was unremarkable. Ct showed distended gallbladder and US showed unusual presentation of dilated gallbladder with question of a gallbladder full of stones. Ddx includes cholelithiasis vs choledocholithiasis vs acute cholecystitis. Less likely cholecystitis given no fever and no tenderness on exam. Imaging findings and lab findings are not classic, so MRCP is ordered for confirmation and possible elective cholecystectomy for tomorrow.    -MRCP pending    All questions were answered to the satisfaction of the patient and family.  The risk and benefits of cholecystectomy were discussed.  Tracy Mcgee has agreed for a cholecystectomy if needed pending MRCP findings.    SRIJANA  BHATTARAI CHHETRI 06/26/2023, 12:06 PM

## 2023-06-26 NOTE — Progress Notes (Signed)
 PROGRESS NOTE    REBECA VALDIVIA  GEX:[id number] DOB: 07-Jun-1965 DOA: 06/25/2023 PCP: Donita Brooks, MD   Brief Narrative:  Tracy Mcgee is a 58 y.o. female with medical history significant for stage III chronic kidney disease, hypertension and osteoarthritis, who presented to the emergency room with acute onset of upper abdominal pain, nausea, and vomiting with loose stools after eating a particularly greasy meal.  Hospitalist called for admission  Assessment & Plan:   Principal Problem:   Elevated LFTs Active Problems:   Essential hypertension   Acute obstructive choledocholithiasis Cholangitis/acute cholecystitis unlikely Intractable nausea vomiting Elevated LFTs -Initial ultrasound unremarkable however MRCP notes obstructive choledocholithiasis at the neck of the gallbladder -General Surgery previously following, recommending further evaluation with GI for ERCP and stones removal prior to possible lap cholecystectomy -Clinically patient appears well -no further episodes of nausea or vomiting - defer to GI in general surgery for dietary recommendations.  He operatively -Hepatitis panel nonreactive  Essential hypertension -Continue home therapy with irbesartan.  DVT prophylaxis: enoxaparin (LOVENOX) injection 40 mg Start: 06/26/23 1000   Code Status:   Code Status: Full Code  Family Communication: None present  Status is: Inpatient  Dispo: The patient is from: Home              Anticipated d/c is to: Home              Anticipated d/c date is: 48 to 72 hours              Patient currently not medically stable for discharge  Consultants:  GI, general surgery  Procedures:  Tentative ERCP and laparoscopic cholecystectomy pending further evaluation workup  Antimicrobials:  None  Subjective: No acute issues or events overnight, patient symptoms appear to have resolved, denies nausea vomiting diarrhea constipation headache fevers chills chest pain abdominal  distention  Objective: Vitals:   06/26/23 0500 06/26/23 0551 06/26/23 0600 06/26/23 0630  BP: 124/80  133/87 123/76  Pulse: 62  62 66  Resp: 10  14 12   Temp:  98 F (36.7 C)    TempSrc:  Oral    SpO2: (!) 88%  94% 94%  Weight:      Height:       No intake or output data in the 24 hours ending 06/26/23 0755 Filed Weights   06/25/23 2136  Weight: 106.6 kg    Examination:  General:  Pleasantly resting in bed, No acute distress. HEENT:  Normocephalic atraumatic.  Sclerae nonicteric, noninjected.  Extraocular movements intact bilaterally. Neck:  Without mass or deformity.  Trachea is midline. Lungs:  Clear to auscultate bilaterally without rhonchi, wheeze, or rales. Heart:  Regular rate and rhythm.  Without murmurs, rubs, or gallops. Abdomen:  Soft, nontender, nondistended.  Without guarding or rebound. Extremities: Without cyanosis, clubbing, edema, or obvious deformity. Skin:  Warm and dry, no erythema.  Data Reviewed: I have personally reviewed following labs and imaging studies  CBC: Recent Labs  Lab 06/25/23 2232 06/26/23 0529  WBC 8.7 7.0  NEUTROABS 5.9  --   HGB 14.5 14.1  HCT 42.6 42.3  MCV 90.1 91.2  PLT 301 301   Basic Metabolic Panel: Recent Labs  Lab 06/25/23 2232 06/26/23 0529  NA 136 138  K 4.1 4.5  CL 101 103  CO2 25 26  GLUCOSE 141* 108*  BUN 16 14  CREATININE 1.05* 1.00  CALCIUM 9.8 9.9   GFR: Estimated Creatinine Clearance: 75.7 mL/min (by C-G formula based on SCr  of 1 mg/dL). Liver Function Tests: Recent Labs  Lab 06/25/23 2232 06/26/23 0529  AST 543* 707*  ALT 313* 553*  ALKPHOS 58 62  BILITOT 1.5* 2.0*  PROT 8.4* 7.8  ALBUMIN 4.1 3.9   Recent Labs  Lab 06/25/23 2232  LIPASE 30   No results for input(s): "AMMONIA" in the last 168 hours. Coagulation Profile: Recent Labs  Lab 06/25/23 2232  INR 0.9    No results found for this or any previous visit (from the past 240 hours).       Radiology Studies: CT ABDOMEN  PELVIS W CONTRAST Result Date: 06/26/2023 CLINICAL DATA:  Intermittent abdominal pain and nausea. EXAM: CT ABDOMEN AND PELVIS WITH CONTRAST TECHNIQUE: Multidetector CT imaging of the abdomen and pelvis was performed using the standard protocol following bolus administration of intravenous contrast. RADIATION DOSE REDUCTION: This exam was performed according to the departmental dose-optimization program which includes automated exposure control, adjustment of the mA and/or kV according to patient size and/or use of iterative reconstruction technique. CONTRAST:  OMNIPAQUE IOHEXOL 300 MG/ML  SOLN COMPARISON:  None Available. FINDINGS: Lower chest: No acute abnormality. Hepatobiliary: No focal liver abnormality is seen. A moderate amount of sludge is suspected within a distended gallbladder, without evidence of gallstones, gallbladder wall thickening, or biliary dilatation. Pancreas: Unremarkable. No pancreatic ductal dilatation or surrounding inflammatory changes. Spleen: Normal in size without focal abnormality. Adrenals/Urinary Tract: Adrenal glands are unremarkable. Kidneys are normal, without obstructing renal calculi, focal lesion, or hydronephrosis. A 3 mm nonobstructing renal calculus is seen within the upper pole of the right kidney. Bladder is unremarkable. Stomach/Bowel: Stomach is within normal limits. Appendix appears normal. No evidence of bowel wall thickening, distention, or inflammatory changes. Noninflamed diverticula are seen throughout the sigmoid colon Vascular/Lymphatic: No significant vascular findings are present. No enlarged abdominal or pelvic lymph nodes. Reproductive: Uterus and bilateral adnexa are unremarkable. Other: No abdominal wall hernia or abnormality. No abdominopelvic ascites. Musculoskeletal: No acute or significant osseous findings. IMPRESSION: 1. Moderate amount of sludge within a distended gallbladder. Correlation with nonemergent right upper quadrant ultrasound is  recommended. 2. 3 mm nonobstructing right renal calculus. 3. Sigmoid diverticulosis. Electronically Signed   By: Aram Candela M.D.   On: 06/26/2023 00:24        Scheduled Meds:  enoxaparin (LOVENOX) injection  40 mg Subcutaneous Q24H   irbesartan  75 mg Oral Daily   multivitamin with minerals  1 tablet Oral Daily   Continuous Infusions:  sodium chloride 100 mL/hr at 06/26/23 0310     LOS: 0 days   Time spent:  Azucena Fallen, DO [facility]  If 7PM-7AM, please contact night-coverage [email or web address]  06/26/2023, 7:55 AM

## 2023-06-26 NOTE — Assessment & Plan Note (Signed)
-   We will continue antihypertensive therapy.

## 2023-06-26 NOTE — ED Notes (Signed)
 ED TO INPATIENT HANDOFF REPORT  ED Nurse Name and Phone #: Asher Muir RN   S Name/Age/Gender Tracy Mcgee 58 y.o. female Room/Bed: APA02/APA02  Code Status   Code Status: Full Code  Home/SNF/Other Home Patient oriented to: self, place, time, and situation Is this baseline? Yes   Triage Complete: Triage complete  Chief Complaint Elevated LFTs [R79.89]  Triage Note Pt to ED POV from home with c/o abd pain that started this afternoon, pt says she has been having episodes of abd pain over the past 12 months, this is the 3rd episode and hasn't went away all day. Pt also reports nausea.   Allergies No Known Allergies  Level of Care/Admitting Diagnosis ED Disposition     ED Disposition  Admit   Condition  --   Comment  Hospital Area: [facility] [[id number]]  Level of Care: Med-Surg [16]  Covid Evaluation: Asymptomatic - no recent exposure (last 10 days) testing not required  Diagnosis: Elevated LFTs [321910]  Admitting Physician: Hannah Beat [[id number]]  Attending Physician: Hannah Beat [[id number]]  Certification:: I certify this patient will need inpatient services for at least 2 midnights  Expected Medical Readiness: 06/28/2023          B Medical/Surgery History Past Medical History:  Diagnosis Date   Arthritis    CKD (chronic kidney disease) stage 3, GFR 30-59 ml/min (HCC)    Hypertension    No past surgical history on file.   A IV Location/Drains/Wounds Patient Lines/Drains/Airways Status     Active Line/Drains/Airways     Name Placement date Placement time Site Days   Peripheral IV 06/25/23 20 G Anterior;Proximal;Right Forearm 06/25/23  2305  Forearm  1            Intake/Output Last 24 hours No intake or output data in the 24 hours ending 06/26/23 0757  Labs/Imaging Results for orders placed or performed during the hospital encounter of 06/25/23 (from the past 48 hours)  CBC with Differential     Status: None   Collection Time: 06/25/23  10:32 PM  Result Value Ref Range   WBC 8.7 4.0 - 10.5 K/uL   RBC 4.73 3.87 - 5.11 MIL/uL   Hemoglobin 14.5 12.0 - 15.0 g/dL   HCT 27.2 53.6 - 64.4 %   MCV 90.1 80.0 - 100.0 fL   MCH 30.7 26.0 - 34.0 pg   MCHC 34.0 30.0 - 36.0 g/dL   RDW 03.4 74.2 - 59.5 %   Platelets 301 150 - 400 K/uL   nRBC 0.0 0.0 - 0.2 %   Neutrophils Relative % 67 %   Neutro Abs 5.9 1.7 - 7.7 K/uL   Lymphocytes Relative 20 %   Lymphs Abs 1.7 0.7 - 4.0 K/uL   Monocytes Relative 8 %   Monocytes Absolute 0.7 0.1 - 1.0 K/uL   Eosinophils Relative 4 %   Eosinophils Absolute 0.3 0.0 - 0.5 K/uL   Basophils Relative 1 %   Basophils Absolute 0.1 0.0 - 0.1 K/uL   Immature Granulocytes 0 %   Abs Immature Granulocytes 0.03 0.00 - 0.07 K/uL    Comment: Performed at [facility], [address], [place], [place] [address]  Comprehensive metabolic panel     Status: Abnormal   Collection Time: 06/25/23 10:32 PM  Result Value Ref Range   Sodium 136 135 - 145 mmol/L   Potassium 4.1 3.5 - 5.1 mmol/L   Chloride 101 98 - 111 mmol/L   CO2 25 22 -  32 mmol/L   Glucose, Bld 141 (H) 70 - 99 mg/dL    Comment: Glucose reference range applies only to samples taken after fasting for at least 8 hours.   BUN 16 6 - 20 mg/dL   Creatinine, Ser 1.61 (H) 0.44 - 1.00 mg/dL   Calcium 9.8 8.9 - 09.6 mg/dL   Total Protein 8.4 (H) 6.5 - 8.1 g/dL   Albumin 4.1 3.5 - 5.0 g/dL   AST 045 (H) 15 - 41 U/L   ALT 313 (H) 0 - 44 U/L   Alkaline Phosphatase 58 38 - 126 U/L   Total Bilirubin 1.5 (H) 0.0 - 1.2 mg/dL   GFR, Estimated >40 >98 mL/min    Comment: (NOTE) Calculated using the CKD-EPI Creatinine Equation (2021)    Anion gap 10 5 - 15    Comment: Performed at [facility], [address], [place], [place] [address]  Lipase, blood     Status: None   Collection Time: 06/25/23 10:32 PM  Result Value Ref Range   Lipase 30 11 - 51 U/L    Comment: Performed at [facility], [address] [place], [place] [address]  Protime-INR     Status:  None   Collection Time: 06/25/23 10:32 PM  Result Value Ref Range   Prothrombin Time 12.6 11.4 - 15.2 seconds   INR 0.9 0.8 - 1.2    Comment: (NOTE) INR goal varies based on device and disease states. Performed at [facility], [address] [place], [place] [address]   Comprehensive metabolic panel     Status: Abnormal   Collection Time: 06/26/23  5:29 AM  Result Value Ref Range   Sodium 138 135 - 145 mmol/L   Potassium 4.5 3.5 - 5.1 mmol/L   Chloride 103 98 - 111 mmol/L   CO2 26 22 - 32 mmol/L   Glucose, Bld 108 (H) 70 - 99 mg/dL    Comment: Glucose reference range applies only to samples taken after fasting for at least 8 hours.   BUN 14 6 - 20 mg/dL   Creatinine, Ser 8.65 0.44 - 1.00 mg/dL   Calcium 9.9 8.9 - 78.4 mg/dL   Total Protein 7.8 6.5 - 8.1 g/dL   Albumin 3.9 3.5 - 5.0 g/dL   AST 696 (H) 15 - 41 U/L   ALT 553 (H) 0 - 44 U/L   Alkaline Phosphatase 62 38 - 126 U/L   Total Bilirubin 2.0 (H) 0.0 - 1.2 mg/dL   GFR, Estimated >29 >52 mL/min    Comment: (NOTE) Calculated using the CKD-EPI Creatinine Equation (2021)    Anion gap 9 5 - 15    Comment: Performed at [facility], [address] [place], [place] [address]  CBC     Status: None   Collection Time: 06/26/23  5:29 AM  Result Value Ref Range   WBC 7.0 4.0 - 10.5 K/uL   RBC 4.64 3.87 - 5.11 MIL/uL   Hemoglobin 14.1 12.0 - 15.0 g/dL   HCT 44.0 10.2 - 72.5 %   MCV 91.2 80.0 - 100.0 fL   MCH 30.4 26.0 - 34.0 pg   MCHC 33.3 30.0 - 36.0 g/dL   RDW 36.6 44.0 - 34.7 %   Platelets 301 150 - 400 K/uL   nRBC 0.0 0.0 - 0.2 %    Comment: Performed at [facility], [address] [place], [place] [address]   CT ABDOMEN PELVIS W CONTRAST Result Date: 06/26/2023 CLINICAL DATA:  Intermittent abdominal pain  and nausea. EXAM: CT ABDOMEN AND PELVIS WITH CONTRAST TECHNIQUE: Multidetector CT imaging of the abdomen and pelvis was performed using the standard protocol following bolus administration of intravenous contrast.  RADIATION DOSE REDUCTION: This exam was performed according to the departmental dose-optimization program which includes automated exposure control, adjustment of the mA and/or kV according to patient size and/or use of iterative reconstruction technique. CONTRAST:  OMNIPAQUE IOHEXOL 300 MG/ML  SOLN COMPARISON:  None Available. FINDINGS: Lower chest: No acute abnormality. Hepatobiliary: No focal liver abnormality is seen. A moderate amount of sludge is suspected within a distended gallbladder, without evidence of gallstones, gallbladder wall thickening, or biliary dilatation. Pancreas: Unremarkable. No pancreatic ductal dilatation or surrounding inflammatory changes. Spleen: Normal in size without focal abnormality. Adrenals/Urinary Tract: Adrenal glands are unremarkable. Kidneys are normal, without obstructing renal calculi, focal lesion, or hydronephrosis. A 3 mm nonobstructing renal calculus is seen within the upper pole of the right kidney. Bladder is unremarkable. Stomach/Bowel: Stomach is within normal limits. Appendix appears normal. No evidence of bowel wall thickening, distention, or inflammatory changes. Noninflamed diverticula are seen throughout the sigmoid colon Vascular/Lymphatic: No significant vascular findings are present. No enlarged abdominal or pelvic lymph nodes. Reproductive: Uterus and bilateral adnexa are unremarkable. Other: No abdominal wall hernia or abnormality. No abdominopelvic ascites. Musculoskeletal: No acute or significant osseous findings. IMPRESSION: 1. Moderate amount of sludge within a distended gallbladder. Correlation with nonemergent right upper quadrant ultrasound is recommended. 2. 3 mm nonobstructing right renal calculus. 3. Sigmoid diverticulosis. Electronically Signed   By: Aram Candela M.D.   On: 06/26/2023 00:24    Pending Labs Unresulted Labs (From admission, onward)     Start     Ordered   06/26/23 0222  HIV Antibody (routine testing w rflx)  (HIV  Antibody (Routine testing w reflex) panel)  Once,   R        06/26/23 0222   06/25/23 2359  Hepatitis panel, acute  Once,   R        06/25/23 2359   06/25/23 2221  Urinalysis, Routine w reflex microscopic -Urine, Clean Catch  (ED Abdominal Pain)  Once,   URGENT       Question:  Specimen Source  Answer:  Urine, Clean Catch   06/25/23 2220            Vitals/Pain Today's Vitals   06/26/23 0500 06/26/23 0551 06/26/23 0600 06/26/23 0630  BP: 124/80  133/87 123/76  Pulse: 62  62 66  Resp: 10  14 12   Temp:  98 F (36.7 C)    TempSrc:  Oral    SpO2: (!) 88%  94% 94%  Weight:      Height:      PainSc:        Isolation Precautions No active isolations  Medications Medications  irbesartan (AVAPRO) tablet 75 mg (has no administration in time range)  multivitamin with minerals tablet 1 tablet (has no administration in time range)  enoxaparin (LOVENOX) injection 40 mg (has no administration in time range)  0.9 %  sodium chloride infusion ( Intravenous New Bag/Given 06/26/23 0310)  acetaminophen (TYLENOL) tablet 650 mg (has no administration in time range)    Or  acetaminophen (TYLENOL) suppository 650 mg (has no administration in time range)  traZODone (DESYREL) tablet 25 mg (has no administration in time range)  magnesium hydroxide (MILK OF MAGNESIA) suspension 30 mL (has no administration in time range)  ondansetron (ZOFRAN) tablet 4 mg (has no administration in time  range)    Or  ondansetron (ZOFRAN) injection 4 mg (has no administration in time range)  morphine (PF) 2 MG/ML injection 2 mg (has no administration in time range)  iohexol (OMNIPAQUE) 300 MG/ML solution 100 mL (100 mLs Intravenous Contrast Given 06/25/23 2329)  morphine (PF) 4 MG/ML injection 4 mg (4 mg Intravenous Given 06/26/23 0050)    Mobility walks     Focused Assessments Cardiac Assessment Handoff:    No results found for: "CKTOTAL", "CKMB", "CKMBINDEX", "TROPONINI" No results found for: "DDIMER" Does  the Patient currently have chest pain? No    R Recommendations: See Admitting Provider Note  Report given to:   Additional Notes:

## 2023-06-26 NOTE — H&P (Addendum)
 [place]   PATIENT NAME: Tracy Mcgee    MR#:  [id number]  DATE OF BIRTH:  30-Aug-1965  DATE OF ADMISSION:  06/25/2023  PRIMARY CARE PHYSICIAN: Donita Brooks, MD   Patient is coming from: Home  REQUESTING/REFERRING PHYSICIAN: Kennis Carina, MD  CHIEF COMPLAINT:   Chief Complaint  Patient presents with   Abdominal Pain    HISTORY OF PRESENT ILLNESS:  Tracy Mcgee is a 58 y.o. female with medical history significant for stage III chronic kidney disease, hypertension and osteoarthritis, who presented to the emergency room with acute onset of upper abdominal pain feeling like a band since yesterday especially after eating cte. she admitted to nausea and vomiting with loose bowel movements.  She denies any bilious vomitus or hematemesis.  No dysuria, oliguria or hematuria or flank pain.  She has been having chills without measured fever.  No chest pain or palpitations.  No cough or wheezing or dyspnea.  ED Course: When she came to the ER, BP was 179/97 with otherwise normal vital signs.  Labs revealed a blood Kos 141, AST 543 and ALT 313, total protein of 8.4 and total bili of 1.5 with albumin 4.1.  CBC was within normal.  Coag profile was normal. EKG as reviewed by me : Sinus rhythm with a rate of 82 with low voltage QRS. Imaging: Pelvic CT scan with contrast revealed the following:  1. Moderate amount of sludge within a distended gallbladder. Correlation with nonemergent right upper quadrant ultrasound is recommended. 2. 3 mm nonobstructing right renal calculus. 3. Sigmoid diverticulosis.  The patient was given 4 mg of IV morphine sulfate.  She will be admitted to a medical-surgical bed for further evaluation and management. PAST MEDICAL HISTORY:   Past Medical History:  Diagnosis Date   Arthritis    CKD (chronic kidney disease) stage 3, GFR 30-59 ml/min (HCC)    Hypertension     PAST SURGICAL HISTORY:  No past surgical history on file. She denies any  previous surgeries. SOCIAL HISTORY:   Social History   Tobacco Use   Smoking status: Never   Smokeless tobacco: Never  Substance Use Topics   Alcohol use: No    FAMILY HISTORY:   Family History  Problem Relation Age of Onset   Colitis Mother    Heart disease Father    Heart disease Brother    Crohn's disease Son    Alzheimer's disease Maternal Grandmother    Leukemia Paternal Grandmother    Heart disease Paternal Grandfather     DRUG ALLERGIES:  No Known Allergies  REVIEW OF SYSTEMS:   ROS As per history of present illness. All pertinent systems were reviewed above. Constitutional, HEENT, cardiovascular, respiratory, GI, GU, musculoskeletal, neuro, psychiatric, endocrine, integumentary and hematologic systems were reviewed and are otherwise negative/unremarkable except for positive findings mentioned above in the HPI.   MEDICATIONS AT HOME:   Prior to Admission medications   Medication Sig Start Date End Date Taking? Authorizing Provider  HYDROcodone bit-homatropine (HYCODAN) 5-1.5 MG/5ML syrup Take 5 mLs by mouth every 8 (eight) hours as needed for cough. 04/04/23   Donita Brooks, MD  Multiple Vitamins-Minerals (MULTIVITAMIN WITH MINERALS) tablet Take 1 tablet by mouth daily.    [provider]  valsartan (DIOVAN) 80 MG tablet Take 1 tablet (80 mg total) by mouth daily. 05/15/23   Donita Brooks, MD      VITAL SIGNS:  Blood pressure 124/76, pulse 67, temperature 98.6 F (37  C), resp. rate 14, height 5\' 6"  (1.676 m), weight 106.6 kg, last menstrual period 04/20/2012, SpO2 (!) 89%.  PHYSICAL EXAMINATION:  Physical Exam  GENERAL:  58 y.o.-year-old Caucasian female patient lying in the bed with no acute distress.  EYES: Pupils equal, round, reactive to light and accommodation. No scleral icterus. Extraocular muscles intact.  HEENT: Head atraumatic, normocephalic. Oropharynx and nasopharynx clear.  NECK:  Supple, no jugular venous distention. No thyroid  enlargement, no tenderness.  LUNGS: Normal breath sounds bilaterally, no wheezing, rales,rhonchi or crepitation. No use of accessory muscles of respiration.  CARDIOVASCULAR: Regular rate and rhythm, S1, S2 normal. No murmurs, rubs, or gallops.  ABDOMEN: Soft, nondistended, with epigastric and  currently nontender though she was tender in the right upper quadrant and epigastric area earlier.  Bowel sounds present. No organomegaly or mass.  EXTREMITIES: No pedal edema, cyanosis, or clubbing.  NEUROLOGIC: Cranial nerves II through XII are intact. Muscle strength 5/5 in all extremities. Sensation intact. Gait not checked.  PSYCHIATRIC: The patient is alert and oriented x 3.  Normal affect and good eye contact. SKIN: No obvious rash, lesion, or ulcer.   LABORATORY PANEL:   CBC Recent Labs  Lab 06/25/23 2232  WBC 8.7  HGB 14.5  HCT 42.6  PLT 301   ------------------------------------------------------------------------------------------------------------------  Chemistries  Recent Labs  Lab 06/25/23 2232  NA 136  K 4.1  CL 101  CO2 25  GLUCOSE 141*  BUN 16  CREATININE 1.05*  CALCIUM 9.8  AST 543*  ALT 313*  ALKPHOS 58  BILITOT 1.5*   ------------------------------------------------------------------------------------------------------------------  Cardiac Enzymes No results for input(s): "TROPONINI" in the last 168 hours. ------------------------------------------------------------------------------------------------------------------  RADIOLOGY:  CT ABDOMEN PELVIS W CONTRAST Result Date: 06/26/2023 CLINICAL DATA:  Intermittent abdominal pain and nausea. EXAM: CT ABDOMEN AND PELVIS WITH CONTRAST TECHNIQUE: Multidetector CT imaging of the abdomen and pelvis was performed using the standard protocol following bolus administration of intravenous contrast. RADIATION DOSE REDUCTION: This exam was performed according to the departmental dose-optimization program which includes  automated exposure control, adjustment of the mA and/or kV according to patient size and/or use of iterative reconstruction technique. CONTRAST:  OMNIPAQUE IOHEXOL 300 MG/ML  SOLN COMPARISON:  None Available. FINDINGS: Lower chest: No acute abnormality. Hepatobiliary: No focal liver abnormality is seen. A moderate amount of sludge is suspected within a distended gallbladder, without evidence of gallstones, gallbladder wall thickening, or biliary dilatation. Pancreas: Unremarkable. No pancreatic ductal dilatation or surrounding inflammatory changes. Spleen: Normal in size without focal abnormality. Adrenals/Urinary Tract: Adrenal glands are unremarkable. Kidneys are normal, without obstructing renal calculi, focal lesion, or hydronephrosis. A 3 mm nonobstructing renal calculus is seen within the upper pole of the right kidney. Bladder is unremarkable. Stomach/Bowel: Stomach is within normal limits. Appendix appears normal. No evidence of bowel wall thickening, distention, or inflammatory changes. Noninflamed diverticula are seen throughout the sigmoid colon Vascular/Lymphatic: No significant vascular findings are present. No enlarged abdominal or pelvic lymph nodes. Reproductive: Uterus and bilateral adnexa are unremarkable. Other: No abdominal wall hernia or abnormality. No abdominopelvic ascites. Musculoskeletal: No acute or significant osseous findings. IMPRESSION: 1. Moderate amount of sludge within a distended gallbladder. Correlation with nonemergent right upper quadrant ultrasound is recommended. 2. 3 mm nonobstructing right renal calculus. 3. Sigmoid diverticulosis. Electronically Signed   By: Aram Candela M.D.   On: 06/26/2023 00:24      IMPRESSION AND PLAN:  Assessment and Plan: * Elevated LFTs - This is associated with cholelithiasis with gallbladder sludge, rule  out acute cholecystitis. - The patient be admitted to a medical-surgical bed. - We will obtain a right upper quadrant  ultrasound in a.m. - Hepatitis panel is currently pending. - General Surgery consult will be obtained. - I notified Dr. Henreitta Leber about the patient with a timed message.  Essential hypertension - We will continue antihypertensive therapy.   DVT prophylaxis: Lovenox.  Advanced Care Planning:  Code Status: full code.  Family Communication:  The plan of care was discussed in details with the patient (and family). I answered all questions. The patient agreed to proceed with the above mentioned plan. Further management will depend upon hospital course. Disposition Plan: Back to previous home environment Consults called: General Surgery. All the records are reviewed and case discussed with ED provider.  Status is: Inpatient  At the time of the admission, it appears that the appropriate admission status for this patient is inpatient.  This is judged to be reasonable and necessary in order to provide the required intensity of service to ensure the patient's safety given the presenting symptoms, physical exam findings and initial radiographic and laboratory data in the context of comorbid conditions.  The patient requires inpatient status due to high intensity of service, high risk of further deterioration and high frequency of surveillance required.  I certify that at the time of admission, it is my clinical judgment that the patient will require inpatient hospital care extending more than 2 midnights.                            Dispo: The patient is from: Home              Anticipated d/c is to: Home              Patient currently is not medically stable to d/c.              Difficult to place patient: No  Hannah Beat M.D on 06/26/2023 at 5:06 AM  [facility]   From 7 PM-7 AM, contact night-coverage [email or web address]  CC: Primary care physician; Donita Brooks, MD

## 2023-06-26 NOTE — Progress Notes (Signed)
 [facility]  MRCP has choledocholithiasis. She will need GI and ERCP.  I have sent GI at [facility] a message to see if anyone available. I have messaged the team. She will need this before any cholecystectomy.   Algis Greenhouse, MD [facility] [address]ella [address] [place], [place] [address] [phone] (office)

## 2023-06-27 ENCOUNTER — Inpatient Hospital Stay (HOSPITAL_COMMUNITY): Admitting: Certified Registered Nurse Anesthetist

## 2023-06-27 ENCOUNTER — Encounter (HOSPITAL_COMMUNITY)
Admission: EM | Disposition: A | Discharge status: Home / Self Care | Payer: Self-pay | Source: Home / Self Care | Attending: Student

## 2023-06-27 ENCOUNTER — Encounter (HOSPITAL_COMMUNITY): Payer: Self-pay | Admitting: Family Medicine

## 2023-06-27 ENCOUNTER — Inpatient Hospital Stay (HOSPITAL_COMMUNITY)

## 2023-06-27 DIAGNOSIS — I10 Essential (primary) hypertension: Secondary | ICD-10-CM | POA: Diagnosis not present

## 2023-06-27 DIAGNOSIS — K8050 Calculus of bile duct without cholangitis or cholecystitis without obstruction: Secondary | ICD-10-CM

## 2023-06-27 DIAGNOSIS — R7989 Other specified abnormal findings of blood chemistry: Secondary | ICD-10-CM | POA: Diagnosis not present

## 2023-06-27 DIAGNOSIS — K8020 Calculus of gallbladder without cholecystitis without obstruction: Secondary | ICD-10-CM

## 2023-06-27 HISTORY — PX: ERCP: SHX5425

## 2023-06-27 HISTORY — PX: SPHINCTEROTOMY: SHX5279

## 2023-06-27 HISTORY — PX: STONE EXTRACTION WITH BASKET: SHX5318

## 2023-06-27 LAB — COMPREHENSIVE METABOLIC PANEL
ALT: 341 U/L — ABNORMAL HIGH (ref 0–44)
AST: 202 U/L — ABNORMAL HIGH (ref 15–41)
Albumin: 3.3 g/dL — ABNORMAL LOW (ref 3.5–5.0)
Alkaline Phosphatase: 59 U/L (ref 38–126)
Anion gap: 5 (ref 5–15)
BUN: 13 mg/dL (ref 6–20)
CO2: 27 mmol/L (ref 22–32)
Calcium: 8.9 mg/dL (ref 8.9–10.3)
Chloride: 106 mmol/L (ref 98–111)
Creatinine, Ser: 1.07 mg/dL — ABNORMAL HIGH (ref 0.44–1.00)
GFR, Estimated: >60 mL/min (ref >60)
Glucose, Bld: 90 mg/dL (ref 70–99)
Potassium: 4.3 mmol/L (ref 3.5–5.1)
Sodium: 138 mmol/L (ref 135–145)
Total Bilirubin: 1.9 mg/dL — ABNORMAL HIGH (ref 0.0–1.2)
Total Protein: 6.7 g/dL (ref 6.5–8.1)

## 2023-06-27 LAB — CBC
HCT: 38.3 % (ref 36.0–46.0)
Hemoglobin: 13 g/dL (ref 12.0–15.0)
MCH: 30.9 pg (ref 26.0–34.0)
MCHC: 33.9 g/dL (ref 30.0–36.0)
MCV: 91 fL (ref 80.0–100.0)
Platelets: 253 10*3/uL (ref 150–400)
RBC: 4.21 MIL/uL (ref 3.87–5.11)
RDW: 12.9 % (ref 11.5–15.5)
WBC: 5.7 10*3/uL (ref 4.0–10.5)
nRBC: 0 % (ref 0.0–0.2)

## 2023-06-27 SURGERY — CHOLECYSTECTOMY, ROBOT-ASSISTED, LAPAROSCOPIC
Anesthesia: General

## 2023-06-27 SURGERY — ERCP, WITH INTERVENTION IF INDICATED
Anesthesia: General

## 2023-06-27 MED ORDER — ONDANSETRON HCL 4 MG/2ML IJ SOLN
INTRAMUSCULAR | Status: DC | PRN
  Administered 2023-06-27: 4 mg via INTRAVENOUS

## 2023-06-27 MED ORDER — FENTANYL CITRATE (PF) 250 MCG/5ML IJ SOLN
INTRAMUSCULAR | Status: DC | PRN
Start: 2023-06-27 — End: 2023-06-27
  Administered 2023-06-27 (×2): 50 ug via INTRAVENOUS

## 2023-06-27 MED ORDER — DEXAMETHASONE SODIUM PHOSPHATE 10 MG/ML IJ SOLN
INTRAMUSCULAR | Status: DC | PRN
Start: 2023-06-27 — End: 2023-06-27
  Administered 2023-06-27: 10 mg via INTRAVENOUS

## 2023-06-27 MED ORDER — FENTANYL CITRATE (PF) 100 MCG/2ML IJ SOLN
INTRAMUSCULAR | Status: AC
  Filled 2023-06-27: qty 2

## 2023-06-27 MED ORDER — SODIUM CHLORIDE 0.9 % IV SOLN: 1.5000 g | INTRAVENOUS | Status: DC

## 2023-06-27 MED ORDER — SODIUM CHLORIDE 0.9 % IV SOLN
INTRAVENOUS | Status: AC
  Filled 2023-06-27: qty 8

## 2023-06-27 MED ORDER — GLUCAGON HCL RDNA (DIAGNOSTIC) 1 MG IJ SOLR
INTRAMUSCULAR | Status: DC | PRN
Start: 2023-06-27 — End: 2023-06-27
  Administered 2023-06-27: .5 mg via INTRAVENOUS

## 2023-06-27 MED ORDER — PHENYLEPHRINE 80 MCG/ML (10ML) SYRINGE FOR IV PUSH (FOR BLOOD PRESSURE SUPPORT)
PREFILLED_SYRINGE | INTRAVENOUS | Status: DC | PRN
  Administered 2023-06-27 (×3): 80 ug via INTRAVENOUS
  Administered 2023-06-27: 160 ug via INTRAVENOUS

## 2023-06-27 MED ORDER — ROCURONIUM BROMIDE 10 MG/ML (PF) SYRINGE
PREFILLED_SYRINGE | INTRAVENOUS | Status: DC | PRN
  Administered 2023-06-27: 50 mg via INTRAVENOUS

## 2023-06-27 MED ORDER — SUGAMMADEX SODIUM 200 MG/2ML IV SOLN
INTRAVENOUS | Status: DC | PRN
  Administered 2023-06-27: 300 mg via INTRAVENOUS

## 2023-06-27 MED ORDER — SODIUM CHLORIDE 0.9 % IV SOLN
3.0000 g | INTRAVENOUS | Status: AC
  Administered 2023-06-27: 3 g via INTRAVENOUS

## 2023-06-27 MED ORDER — GLUCAGON HCL RDNA (DIAGNOSTIC) 1 MG IJ SOLR
INTRAMUSCULAR | Status: AC
  Filled 2023-06-27: qty 1

## 2023-06-27 MED ORDER — PHENYLEPHRINE HCL-NACL 20-0.9 MG/250ML-% IV SOLN
INTRAVENOUS | Status: DC | PRN
  Administered 2023-06-27: 40 ug/min via INTRAVENOUS

## 2023-06-27 MED ORDER — LACTATED RINGERS IV SOLN
INTRAVENOUS | Status: AC | PRN
  Administered 2023-06-27: 1000 mL via INTRAVENOUS

## 2023-06-27 MED ORDER — INDOMETHACIN 50 MG RE SUPP
RECTAL | Status: AC
  Filled 2023-06-27: qty 2

## 2023-06-27 MED ORDER — INDOMETHACIN 50 MG RE SUPP
100.0000 mg | Freq: Once | RECTAL | Status: AC
  Administered 2023-06-27: 100 mg via RECTAL
  Filled 2023-06-27: qty 2

## 2023-06-27 MED ORDER — SODIUM CHLORIDE 0.9 % IV SOLN: INTRAVENOUS | Status: DC

## 2023-06-27 MED ORDER — MIDAZOLAM HCL 2 MG/2ML IJ SOLN
INTRAMUSCULAR | Status: AC
  Filled 2023-06-27: qty 2

## 2023-06-27 MED ORDER — MIDAZOLAM HCL 2 MG/2ML IJ SOLN
INTRAMUSCULAR | Status: DC | PRN
  Administered 2023-06-27: 2 mg via INTRAVENOUS

## 2023-06-27 MED ORDER — SODIUM CHLORIDE 0.9 % IV SOLN
INTRAVENOUS | Status: DC | PRN
  Administered 2023-06-27: 40 mL

## 2023-06-27 MED ORDER — PROPOFOL 10 MG/ML IV BOLUS
INTRAVENOUS | Status: DC | PRN
  Administered 2023-06-27: 150 mg via INTRAVENOUS

## 2023-06-27 MED ORDER — LIDOCAINE 2% (20 MG/ML) 5 ML SYRINGE
INTRAMUSCULAR | Status: DC | PRN
  Administered 2023-06-27: 100 mg via INTRAVENOUS

## 2023-06-27 NOTE — Plan of Care (Signed)

## 2023-06-27 NOTE — Consult Note (Signed)
 Consult Note  Tracy Mcgee 1965-07-03  [id number].    Requesting MD: Osvaldo Shipper, MD Chief Complaint/Reason for Consult: Choledocholithiasis  HPI:  Patient is a 58 year old female who was transferred here from [facility] with choledocholithiasis for GI and consideration of ERCP. Patient reports initially pain was across her upper abdomen and radiating around to the back. She had associated nausea and vomiting but no fever or chills. Pain is currently resolved. She reports 2 similar episodes previously that did not last as long over the last year. She has never had surgery previously. Not on blood thinners. PMH otherwise significant for HTN, CKD stage III and arthritis. NKDA. 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   ROS: Negative other than HPI  Family History  Problem Relation Age of Onset   Colitis Mother    Heart disease Father    Heart disease Brother    Crohn's disease Son    Alzheimer's disease Maternal Grandmother    Leukemia Paternal Grandmother    Heart disease Paternal Grandfather     Past Medical History:  Diagnosis Date   Arthritis    CKD (chronic kidney disease) stage 3, GFR 30-59 ml/min (HCC)    Hypertension     History reviewed. No pertinent surgical history.  Social History:  reports that she has never smoked. She has never used smokeless tobacco. She reports that she does not drink alcohol and does not use drugs.  Allergies: No Known Allergies  Medications Prior to Admission  Medication Sig Dispense Refill   Multiple Vitamins-Minerals (MULTIVITAMIN WITH MINERALS) tablet Take 1 tablet by mouth daily.     valsartan (DIOVAN) 80 MG tablet Take 1 tablet (80 mg total) by mouth daily. 90 tablet 1    Blood pressure (!) 133/98, pulse 95, temperature 98 F (36.7 C), resp. rate 18, height 5\' 6"  (1.676 m), weight 106.6 kg, last menstrual period 04/20/2012, SpO2 96%. Physical Exam:  General: pleasant, WD, obese female who is laying  in bed in NAD HEENT: head is normocephalic, atraumatic.  Sclera are anicteric.  Pupils equal and round. EOMI.  Ears and nose without any masses or lesions.  Mouth is pink and moist Heart: regular, rate, and rhythm.   Lungs: CTAB, no wheezes, rhonchi, or rales noted.  Respiratory effort nonlabored Abd: soft, NT, ND, +BS, no masses, hernias, or organomegaly MS: all 4 extremities are symmetrical with no cyanosis, clubbing, or edema. Skin: warm and dry with no masses, lesions, or rashes Neuro: Cranial nerves 2-12 grossly intact, sensation is normal throughout Psych: A&Ox3 with an appropriate affect.   Results for orders placed or performed during the hospital encounter of 06/25/23 (from the past 48 hours)  CBC with Differential     Status: None   Collection Time: 06/25/23 10:32 PM  Result Value Ref Range   WBC 8.7 4.0 - 10.5 K/uL   RBC 4.73 3.87 - 5.11 MIL/uL   Hemoglobin 14.5 12.0 - 15.0 g/dL   HCT 47.8 29.5 - 62.1 %   MCV 90.1 80.0 - 100.0 fL   MCH 30.7 26.0 - 34.0 pg   MCHC 34.0 30.0 - 36.0 g/dL   RDW 30.8 65.7 - 84.6 %   Platelets 301 150 - 400 K/uL   nRBC 0.0 0.0 - 0.2 %   Neutrophils Relative % 67 %   Neutro Abs 5.9 1.7 - 7.7 K/uL   Lymphocytes Relative 20 %   Lymphs Abs 1.7 0.7 -  4.0 K/uL   Monocytes Relative 8 %   Monocytes Absolute 0.7 0.1 - 1.0 K/uL   Eosinophils Relative 4 %   Eosinophils Absolute 0.3 0.0 - 0.5 K/uL   Basophils Relative 1 %   Basophils Absolute 0.1 0.0 - 0.1 K/uL   Immature Granulocytes 0 %   Abs Immature Granulocytes 0.03 0.00 - 0.07 K/uL    Comment: Performed at [facility], [address] [place], [place] [address]  Comprehensive metabolic panel     Status: Abnormal   Collection Time: 06/25/23 10:32 PM  Result Value Ref Range   Sodium 136 135 - 145 mmol/L   Potassium 4.1 3.5 - 5.1 mmol/L   Chloride 101 98 - 111 mmol/L   CO2 25 22 - 32 mmol/L   Glucose, Bld 141 (H) 70 - 99 mg/dL    Comment: Glucose reference range applies only to samples taken  after fasting for at least 8 hours.   BUN 16 6 - 20 mg/dL   Creatinine, Ser 1.02 (H) 0.44 - 1.00 mg/dL   Calcium 9.8 8.9 - 72.5 mg/dL   Total Protein 8.4 (H) 6.5 - 8.1 g/dL   Albumin 4.1 3.5 - 5.0 g/dL   AST 366 (H) 15 - 41 U/L   ALT 313 (H) 0 - 44 U/L   Alkaline Phosphatase 58 38 - 126 U/L   Total Bilirubin 1.5 (H) 0.0 - 1.2 mg/dL   GFR, Estimated >44 >03 mL/min    Comment: (NOTE) Calculated using the CKD-EPI Creatinine Equation (2021)    Anion gap 10 5 - 15    Comment: Performed at [facility], [address] [place], [place] [address]  Lipase, blood     Status: None   Collection Time: 06/25/23 10:32 PM  Result Value Ref Range   Lipase 30 11 - 51 U/L    Comment: Performed at [facility], [address], [place], [place] [address]  Protime-INR     Status: None   Collection Time: 06/25/23 10:32 PM  Result Value Ref Range   Prothrombin Time 12.6 11.4 - 15.2 seconds   INR 0.9 0.8 - 1.2    Comment: (NOTE) INR goal varies based on device and disease states. Performed at [facility], [address] [place], [place] [address]   Hepatitis panel, acute     Status: None   Collection Time: 06/25/23 11:48 PM  Result Value Ref Range   Hepatitis B Surface Ag NON REACTIVE NON REACTIVE   HCV Ab NON REACTIVE NON REACTIVE    Comment: (NOTE) Nonreactive HCV antibody screen is consistent with no HCV infections,  unless recent infection is suspected or other evidence exists to indicate HCV infection.     Hep A IgM NON REACTIVE NON REACTIVE   Hep B C IgM NON REACTIVE NON REACTIVE    Comment: Performed at [facility], [address] [place], [place] [address]  HIV Antibody (routine testing w rflx)     Status: None   Collection Time: 06/25/23 11:48 PM  Result Value Ref Range   HIV Screen 4th Generation wRfx Non Reactive Non Reactive    Comment: Performed at [facility], [address], [place], [place] [address]  Comprehensive metabolic panel     Status: Abnormal    Collection Time: 06/26/23  5:29 AM  Result Value Ref Range   Sodium 138 135 - 145 mmol/L   Potassium 4.5 3.5 - 5.1 mmol/L   Chloride 103 98 - 111 mmol/L  CO2 26 22 - 32 mmol/L   Glucose, Bld 108 (H) 70 - 99 mg/dL    Comment: Glucose reference range applies only to samples taken after fasting for at least 8 hours.   BUN 14 6 - 20 mg/dL   Creatinine, Ser 1.61 0.44 - 1.00 mg/dL   Calcium 9.9 8.9 - 09.6 mg/dL   Total Protein 7.8 6.5 - 8.1 g/dL   Albumin 3.9 3.5 - 5.0 g/dL   AST 045 (H) 15 - 41 U/L   ALT 553 (H) 0 - 44 U/L   Alkaline Phosphatase 62 38 - 126 U/L   Total Bilirubin 2.0 (H) 0.0 - 1.2 mg/dL   GFR, Estimated >40 >98 mL/min    Comment: (NOTE) Calculated using the CKD-EPI Creatinine Equation (2021)    Anion gap 9 5 - 15    Comment: Performed at [facility], [address] [place], [place] [address]  CBC     Status: None   Collection Time: 06/26/23  5:29 AM  Result Value Ref Range   WBC 7.0 4.0 - 10.5 K/uL   RBC 4.64 3.87 - 5.11 MIL/uL   Hemoglobin 14.1 12.0 - 15.0 g/dL   HCT 78.2 95.6 - 21.3 %   MCV 91.2 80.0 - 100.0 fL   MCH 30.4 26.0 - 34.0 pg   MCHC 33.3 30.0 - 36.0 g/dL   RDW 08.6 57.8 - 46.9 %   Platelets 301 150 - 400 K/uL   nRBC 0.0 0.0 - 0.2 %    Comment: Performed at [facility], [address] [place], [place] [address]  CBC     Status: None   Collection Time: 06/27/23  4:33 AM  Result Value Ref Range   WBC 5.7 4.0 - 10.5 K/uL   RBC 4.21 3.87 - 5.11 MIL/uL   Hemoglobin 13.0 12.0 - 15.0 g/dL   HCT 84.1 32.4 - 40.1 %   MCV 91.0 80.0 - 100.0 fL   MCH 30.9 26.0 - 34.0 pg   MCHC 33.9 30.0 - 36.0 g/dL   RDW 02.7 25.3 - 66.4 %   Platelets 253 150 - 400 K/uL   nRBC 0.0 0.0 - 0.2 %    Comment: Performed at [facility], [address], [place], [place] [address]  Comprehensive metabolic panel     Status: Abnormal   Collection Time: 06/27/23  4:33 AM  Result Value Ref Range   Sodium 138 135 - 145 mmol/L   Potassium 4.3 3.5 - 5.1 mmol/L   Chloride  106 98 - 111 mmol/L   CO2 27 22 - 32 mmol/L   Glucose, Bld 90 70 - 99 mg/dL    Comment: Glucose reference range applies only to samples taken after fasting for at least 8 hours.   BUN 13 6 - 20 mg/dL   Creatinine, Ser 4.25 (H) 0.44 - 1.00 mg/dL   Calcium 8.9 8.9 - 95.6 mg/dL   Total Protein 6.7 6.5 - 8.1 g/dL   Albumin 3.3 (L) 3.5 - 5.0 g/dL   AST 387 (H) 15 - 41 U/L   ALT 341 (H) 0 - 44 U/L   Alkaline Phosphatase 59 38 - 126 U/L   Total Bilirubin 1.9 (H) 0.0 - 1.2 mg/dL   GFR, Estimated >56 >43 mL/min    Comment: (NOTE) Calculated using the CKD-EPI Creatinine Equation (2021)    Anion gap 5 5 - 15    Comment: Performed at [facility], [address], [place], [place]  [address]   MR ABDOMEN MRCP W WO CONTAST Result Date: 06/26/2023 CLINICAL DATA:  Elevated liver enzymes, right upper quadrant pain EXAM: MRI ABDOMEN WITHOUT AND WITH CONTRAST (INCLUDING MRCP) TECHNIQUE: Multiplanar multisequence MR imaging of the abdomen was performed both before and after the administration of intravenous contrast. Heavily T2-weighted images of the biliary and pancreatic ducts were obtained, and three-dimensional MRCP images were rendered by post processing. CONTRAST:  10mL GADAVIST GADOBUTROL 1 MMOL/ML IV SOLN COMPARISON:  CT abdomen pelvis, 06/25/2023 FINDINGS: Lower chest: No acute abnormality. Hepatobiliary: No solid liver abnormality is seen. Gallbladder is distended by innumerable tiny calculi (series 4, image 22). No gallbladder wall thickening. At least one small gallstone in the central common bile duct near the ampulla measuring 0.4 cm (series 4, image 23), possibly additional tiny calculi or gravel. Common bile duct is nondistended. There may be minimal intrahepatic biliary ductal dilatation. Pancreas: Unremarkable. No pancreatic ductal dilatation or surrounding inflammatory changes. Spleen: Normal in size without significant abnormality. Adrenals/Urinary Tract: Adrenal glands are unremarkable.  Kidneys are normal, without renal calculi, solid lesion, or hydronephrosis. Stomach/Bowel: Stomach is within normal limits. No evidence of bowel wall thickening, distention, or inflammatory changes. Vascular/Lymphatic: No significant vascular findings are present. No enlarged abdominal lymph nodes. Other: No abdominal wall hernia or abnormality. No ascites. Musculoskeletal: No acute or significant osseous findings. IMPRESSION: 1. Cholelithiasis without evidence of acute cholecystitis; gallbladder is distended by innumerable tiny calculi without gallbladder wall thickening or pericholecystic fluid. 2. Choledocholithiasis; at least one small gallstone in the central common bile duct near the ampulla measuring 0.4 cm, possibly additional tiny calculi or gravel. Common bile duct is nondistended. There may be minimal intrahepatic biliary ductal dilatation. Electronically Signed   By: Jearld Lesch M.D.   On: 06/26/2023 14:15   MR 3D Recon At Scanner Result Date: 06/26/2023 CLINICAL DATA:  Elevated liver enzymes, right upper quadrant pain EXAM: MRI ABDOMEN WITHOUT AND WITH CONTRAST (INCLUDING MRCP) TECHNIQUE: Multiplanar multisequence MR imaging of the abdomen was performed both before and after the administration of intravenous contrast. Heavily T2-weighted images of the biliary and pancreatic ducts were obtained, and three-dimensional MRCP images were rendered by post processing. CONTRAST:  10mL GADAVIST GADOBUTROL 1 MMOL/ML IV SOLN COMPARISON:  CT abdomen pelvis, 06/25/2023 FINDINGS: Lower chest: No acute abnormality. Hepatobiliary: No solid liver abnormality is seen. Gallbladder is distended by innumerable tiny calculi (series 4, image 22). No gallbladder wall thickening. At least one small gallstone in the central common bile duct near the ampulla measuring 0.4 cm (series 4, image 23), possibly additional tiny calculi or gravel. Common bile duct is nondistended. There may be minimal intrahepatic biliary ductal  dilatation. Pancreas: Unremarkable. No pancreatic ductal dilatation or surrounding inflammatory changes. Spleen: Normal in size without significant abnormality. Adrenals/Urinary Tract: Adrenal glands are unremarkable. Kidneys are normal, without renal calculi, solid lesion, or hydronephrosis. Stomach/Bowel: Stomach is within normal limits. No evidence of bowel wall thickening, distention, or inflammatory changes. Vascular/Lymphatic: No significant vascular findings are present. No enlarged abdominal lymph nodes. Other: No abdominal wall hernia or abnormality. No ascites. Musculoskeletal: No acute or significant osseous findings. IMPRESSION: 1. Cholelithiasis without evidence of acute cholecystitis; gallbladder is distended by innumerable tiny calculi without gallbladder wall thickening or pericholecystic fluid. 2. Choledocholithiasis; at least one small gallstone in the central common bile duct near the ampulla measuring 0.4 cm, possibly additional tiny calculi or gravel. Common bile duct is nondistended. There may be minimal intrahepatic biliary ductal dilatation. Electronically Signed   By: Trinna Post  Jane Canary M.D.   On: 06/26/2023 14:15   US Abdomen Limited RUQ (LIVER/GB) Result Date: 06/26/2023 CLINICAL DATA:  Right upper quadrant abdominal pain EXAM: ULTRASOUND ABDOMEN LIMITED RIGHT UPPER QUADRANT COMPARISON:  CT 06/25/2023 FINDINGS: Gallbladder: Dilated gallbladder. Slight gallbladder wall thickening of 5 mm. Echogenic appearing wall with shadowed areas of the gallbladder. This could be a gallbladder full of stones but not classic in appearance. Common bile duct: Diameter: 4 mm Liver: No focal lesion identified. Within normal limits in parenchymal echogenicity. Portal vein is patent on color Doppler imaging with normal direction of blood flow towards the liver. Other: Evaluation limited with overlapping bowel gas and soft tissue. IMPRESSION: Dilated gallbladder with question of a gallbladder full of stones but  there overall limitations and the appearance is not classic. Recommend further workup with MRCP to further delineate. No ductal dilatation. Electronically Signed   By: Karen Kays M.D.   On: 06/26/2023 10:21   CT ABDOMEN PELVIS W CONTRAST Result Date: 06/26/2023 CLINICAL DATA:  Intermittent abdominal pain and nausea. EXAM: CT ABDOMEN AND PELVIS WITH CONTRAST TECHNIQUE: Multidetector CT imaging of the abdomen and pelvis was performed using the standard protocol following bolus administration of intravenous contrast. RADIATION DOSE REDUCTION: This exam was performed according to the departmental dose-optimization program which includes automated exposure control, adjustment of the mA and/or kV according to patient size and/or use of iterative reconstruction technique. CONTRAST:  OMNIPAQUE IOHEXOL 300 MG/ML  SOLN COMPARISON:  None Available. FINDINGS: Lower chest: No acute abnormality. Hepatobiliary: No focal liver abnormality is seen. A moderate amount of sludge is suspected within a distended gallbladder, without evidence of gallstones, gallbladder wall thickening, or biliary dilatation. Pancreas: Unremarkable. No pancreatic ductal dilatation or surrounding inflammatory changes. Spleen: Normal in size without focal abnormality. Adrenals/Urinary Tract: Adrenal glands are unremarkable. Kidneys are normal, without obstructing renal calculi, focal lesion, or hydronephrosis. A 3 mm nonobstructing renal calculus is seen within the upper pole of the right kidney. Bladder is unremarkable. Stomach/Bowel: Stomach is within normal limits. Appendix appears normal. No evidence of bowel wall thickening, distention, or inflammatory changes. Noninflamed diverticula are seen throughout the sigmoid colon Vascular/Lymphatic: No significant vascular findings are present. No enlarged abdominal or pelvic lymph nodes. Reproductive: Uterus and bilateral adnexa are unremarkable. Other: No abdominal wall hernia or abnormality. No  abdominopelvic ascites. Musculoskeletal: No acute or significant osseous findings. IMPRESSION: 1. Moderate amount of sludge within a distended gallbladder. Correlation with nonemergent right upper quadrant ultrasound is recommended. 2. 3 mm nonobstructing right renal calculus. 3. Sigmoid diverticulosis. Electronically Signed   By: Aram Candela M.D.   On: 06/26/2023 00:24      Assessment/Plan Cholelithiasis with choledocholithiasis  - [place] with significant cholelithiasis  - MRCP with 4 mm gallstone in the central CBD near the ampulla, no CBD distention and minimal intrahepatic biliary ductal dilatation  - Tbili 1.9, AST/ALT are downtrending from yesterday - no current abdominal pain  - GI has seen and planning ERCP today  - will tentatively plan lap chole tomorrow  - ok to have diet after ERCP but please make NPO after MN  - I have explained the procedure, risks, and aftercare of Laparoscopic cholecystectomy.  Risks include but are not limited to anesthesia (MI, CVA, death, prolonged intubation and aspiration), bleeding, infection, wound problems, hernia, bile leak, injury to common bile duct/liver/intestine, possible need for subtotal cholecystectomy or open cholecystectomy, increased risk of DVT/PE and diarrhea post op. She seems to understand and agrees to proceed.  FEN: NPO for ERCP VTE: LMWH ID: no current abx   I reviewed Consultant GI notes, hospitalist notes, last 24 h vitals and pain scores, last 48 h intake and output, last 24 h labs and trends, and last 24 h imaging results.  This care required high  level of medical decision making.   Juliet Rude, PA-C [facility] 06/27/2023, 11:00 AM Please see Amion for pager number during day hours 7:00am-4:30pm

## 2023-06-27 NOTE — Anesthesia Postprocedure Evaluation (Signed)
 Anesthesia Post Note  Patient: Tracy Mcgee  Procedure(s) Performed: ERCP, WITH INTERVENTION IF INDICATED SPHINCTEROTOMY ERCP, WITH LITHROTRIPSY OR REMOVAL OF COMMON BILE DUCT CALCULUS     Patient location during evaluation: Endoscopy Anesthesia Type: General Level of consciousness: awake and alert Pain management: pain level controlled Vital Signs Assessment: post-procedure vital signs reviewed and stable Respiratory status: spontaneous breathing, nonlabored ventilation, respiratory function stable and patient connected to nasal cannula oxygen Cardiovascular status: blood pressure returned to baseline and stable Postop Assessment: no apparent nausea or vomiting Anesthetic complications: no  No notable events documented.  Last Vitals:  Vitals:   06/27/23 1450 06/27/23 1500  BP: 124/86 124/74  Pulse: 79 73  Resp: 12 15  Temp:    SpO2: 92% 96%    Last Pain:  Vitals:   06/27/23 1500  TempSrc:   PainSc: 0-No pain                 Konnar Ben L Latausha Flamm

## 2023-06-27 NOTE — Anesthesia Preprocedure Evaluation (Addendum)
 Anesthesia Evaluation  Patient identified by MRN, date of birth, ID band Patient awake    Reviewed: Allergy & Precautions, NPO status , Patient's Chart, lab work & pertinent test results  Airway Mallampati: II  TM Distance: >3 FB Neck ROM: Full    Dental  (+) Teeth Intact, Dental Advisory Given   Pulmonary neg pulmonary ROS   Pulmonary exam normal breath sounds clear to auscultation       Cardiovascular hypertension, Pt. on medications Normal cardiovascular exam Rhythm:Regular Rate:Normal  TTE 2013 - Left ventricle: The cavity size was normal. Wall thickness was    normal. Systolic function was normal. The estimated ejection    fraction was in the range of 60% to 65%. Wall motion was normal;    there were no regional wall motion abnormalities.     Neuro/Psych negative neurological ROS  negative psych ROS   GI/Hepatic negative GI ROS, Neg liver ROS,,,  Endo/Other  negative endocrine ROS    Renal/GU Renal InsufficiencyRenal disease  negative genitourinary   Musculoskeletal negative musculoskeletal ROS (+)    Abdominal   Peds  Hematology negative hematology ROS (+)   Anesthesia Other Findings   Reproductive/Obstetrics                             Anesthesia Physical Anesthesia Plan  ASA: 2  Anesthesia Plan: General   Post-op Pain Management: Minimal or no pain anticipated   Induction: Intravenous  PONV Risk Score and Plan: 3 and Midazolam, Dexamethasone and Ondansetron  Airway Management Planned: Oral ETT  Additional Equipment:   Intra-op Plan:   Post-operative Plan: Extubation in OR  Informed Consent: I have reviewed the patients History and Physical, chart, labs and discussed the procedure including the risks, benefits and alternatives for the proposed anesthesia with the patient or authorized representative who has indicated his/her understanding and acceptance.      Dental advisory given  Plan Discussed with: CRNA  Anesthesia Plan Comments:        Anesthesia Quick Evaluation

## 2023-06-27 NOTE — Progress Notes (Addendum)
 [facility] PROGRESS NOTE   Tracy Mcgee XBJ:[id number] DOB: Jan 29, 1966 DOA: 06/25/2023  PCP: Donita Brooks, MD  Brief History:  58 y.o. female with medical history significant for stage III chronic kidney disease, hypertension and osteoarthritis, who presented to the emergency room with acute onset of upper abdominal pain, nausea, and vomiting with loose stools after eating a particularly greasy meal.  Found to have abnormal LFTs.  Imaging studies suggested cholecystitis.  Patient was hospitalized for further management  Consultants: General surgery.  Gastroenterology  Procedures: ERCP is planned during this admission.  Cholecystectomy is planned during this admission    Subjective/Interval History: Patient denies any abdominal pain or nausea this morning.  Understands the findings on various imaging studies.  Understands the need to undergo ERCP.    Assessment/Plan:  Cholelithiasis with choledocholithiasis/abnormal LFTs Concern for cholecystitis on ultrasound but not noted on MRCP. MRCP did show choledocholithiasis. Elevated AST and ALT and bilirubin noted.  Lipase level was noted to be normal. Gastroenterology has been consulted with plans for ERCP during this hospitalization.  Currently NPO. Not very symptomatic at this time. Was seen by general surgery at outside hospital who recommended cholecystectomy.  This is also recommended by gastroenterology.  General surgery has been consulted here. Improvement in LFTs noted.  Hepatitis panel was unremarkable.  Essential hypertension Noted to be on irbesartan which is continued.  Obesity Estimated body mass index is 37.93 kg/m as calculated from the following:   Height as of this encounter: 5\' 6"  (1.676 m).   Weight as of this encounter: 106.6 kg.   DVT Prophylaxis: Lovenox Code Status: Full code Family Communication: Discussed with patient Disposition Plan: Anticipate discharge home when better.  Status is:  Inpatient Remains inpatient appropriate because: Choledocholithiasis      Medications: Scheduled:  enoxaparin (LOVENOX) injection  40 mg Subcutaneous Q24H   irbesartan  75 mg Oral Daily   multivitamin with minerals  1 tablet Oral Daily   Continuous:  ampicillin-sulbactam (UNASYN) 1.5 g in sodium chloride 0.9 % 100 mL IVPB     HYQ:MVHQIONGEXBMW **OR** acetaminophen, magnesium hydroxide, morphine injection, ondansetron **OR** ondansetron (ZOFRAN) IV, traZODone  Antibiotics: Anti-infectives (From admission, onward)    Start     Dose/Rate Route Frequency Ordered Stop   06/27/23 1110  ampicillin-sulbactam (UNASYN) 1.5 g in sodium chloride 0.9 % 100 mL IVPB        1.5 g 200 mL/hr over 30 Minutes Intravenous 60 min pre-op 06/27/23 1110         Objective:  Vital Signs  Vitals:   06/26/23 1809 06/26/23 1900 06/27/23 0509 06/27/23 0754  BP: (!) 154/93 139/86 130/85 (!) 133/98  Pulse: 77 71 71 95  Resp:  18 18   Temp: 97.8 F (36.6 C) 98.1 F (36.7 C) 98.7 F (37.1 C) 98 F (36.7 C)  TempSrc: Oral Oral Oral   SpO2: 95% 96% 96% 96%  Weight:      Height:       No intake or output data in the 24 hours ending 06/27/23 1114 Filed Weights   06/25/23 2136  Weight: 106.6 kg    General appearance: Awake alert.  In no distress Resp: Clear to auscultation bilaterally.  Normal effort Cardio: S1-S2 is normal regular.  No S3-S4.  No rubs murmurs or bruit GI: Abdomen is soft.  Nontender nondistended.  Bowel sounds are present normal.  No masses organomegaly Extremities: No edema.  Full range of motion of lower extremities. Neurologic: Alert and oriented  x3.  No focal neurological deficits.    Lab Results:  Data Reviewed: I have personally reviewed following labs and reports of the imaging studies  CBC: Recent Labs  Lab 06/25/23 2232 06/26/23 0529 06/27/23 0433  WBC 8.7 7.0 5.7  NEUTROABS 5.9  --   --   HGB 14.5 14.1 13.0  HCT 42.6 42.3 38.3  MCV 90.1 91.2 91.0  PLT  301 301 253    Basic Metabolic Panel: Recent Labs  Lab 06/25/23 2232 06/26/23 0529 06/27/23 0433  NA 136 138 138  K 4.1 4.5 4.3  CL 101 103 106  CO2 25 26 27   GLUCOSE 141* 108* 90  BUN 16 14 13   CREATININE 1.05* 1.00 1.07*  CALCIUM 9.8 9.9 8.9    GFR: Estimated Creatinine Clearance: 70.7 mL/min (A) (by C-G formula based on SCr of 1.07 mg/dL (H)).  Liver Function Tests: Recent Labs  Lab 06/25/23 2232 06/26/23 0529 06/27/23 0433  AST 543* 707* 202*  ALT 313* 553* 341*  ALKPHOS 58 62 59  BILITOT 1.5* 2.0* 1.9*  PROT 8.4* 7.8 6.7  ALBUMIN 4.1 3.9 3.3*    Recent Labs  Lab 06/25/23 2232  LIPASE 30    Coagulation Profile: Recent Labs  Lab 06/25/23 2232  INR 0.9    Radiology Studies: MR ABDOMEN MRCP W WO CONTAST Result Date: 06/26/2023 CLINICAL DATA:  Elevated liver enzymes, right upper quadrant pain EXAM: MRI ABDOMEN WITHOUT AND WITH CONTRAST (INCLUDING MRCP) TECHNIQUE: Multiplanar multisequence MR imaging of the abdomen was performed both before and after the administration of intravenous contrast. Heavily T2-weighted images of the biliary and pancreatic ducts were obtained, and three-dimensional MRCP images were rendered by post processing. CONTRAST:  10mL GADAVIST GADOBUTROL 1 MMOL/ML IV SOLN COMPARISON:  CT abdomen pelvis, 06/25/2023 FINDINGS: Lower chest: No acute abnormality. Hepatobiliary: No solid liver abnormality is seen. Gallbladder is distended by innumerable tiny calculi (series 4, image 22). No gallbladder wall thickening. At least one small gallstone in the central common bile duct near the ampulla measuring 0.4 cm (series 4, image 23), possibly additional tiny calculi or gravel. Common bile duct is nondistended. There may be minimal intrahepatic biliary ductal dilatation. Pancreas: Unremarkable. No pancreatic ductal dilatation or surrounding inflammatory changes. Spleen: Normal in size without significant abnormality. Adrenals/Urinary Tract: Adrenal glands  are unremarkable. Kidneys are normal, without renal calculi, solid lesion, or hydronephrosis. Stomach/Bowel: Stomach is within normal limits. No evidence of bowel wall thickening, distention, or inflammatory changes. Vascular/Lymphatic: No significant vascular findings are present. No enlarged abdominal lymph nodes. Other: No abdominal wall hernia or abnormality. No ascites. Musculoskeletal: No acute or significant osseous findings. IMPRESSION: 1. Cholelithiasis without evidence of acute cholecystitis; gallbladder is distended by innumerable tiny calculi without gallbladder wall thickening or pericholecystic fluid. 2. Choledocholithiasis; at least one small gallstone in the central common bile duct near the ampulla measuring 0.4 cm, possibly additional tiny calculi or gravel. Common bile duct is nondistended. There may be minimal intrahepatic biliary ductal dilatation. Electronically Signed   By: Jearld Lesch M.D.   On: 06/26/2023 14:15   MR 3D Recon At Scanner Result Date: 06/26/2023 CLINICAL DATA:  Elevated liver enzymes, right upper quadrant pain EXAM: MRI ABDOMEN WITHOUT AND WITH CONTRAST (INCLUDING MRCP) TECHNIQUE: Multiplanar multisequence MR imaging of the abdomen was performed both before and after the administration of intravenous contrast. Heavily T2-weighted images of the biliary and pancreatic ducts were obtained, and three-dimensional MRCP images were rendered by post processing. CONTRAST:  10mL GADAVIST GADOBUTROL 1  MMOL/ML IV SOLN COMPARISON:  CT abdomen pelvis, 06/25/2023 FINDINGS: Lower chest: No acute abnormality. Hepatobiliary: No solid liver abnormality is seen. Gallbladder is distended by innumerable tiny calculi (series 4, image 22). No gallbladder wall thickening. At least one small gallstone in the central common bile duct near the ampulla measuring 0.4 cm (series 4, image 23), possibly additional tiny calculi or gravel. Common bile duct is nondistended. There may be minimal intrahepatic  biliary ductal dilatation. Pancreas: Unremarkable. No pancreatic ductal dilatation or surrounding inflammatory changes. Spleen: Normal in size without significant abnormality. Adrenals/Urinary Tract: Adrenal glands are unremarkable. Kidneys are normal, without renal calculi, solid lesion, or hydronephrosis. Stomach/Bowel: Stomach is within normal limits. No evidence of bowel wall thickening, distention, or inflammatory changes. Vascular/Lymphatic: No significant vascular findings are present. No enlarged abdominal lymph nodes. Other: No abdominal wall hernia or abnormality. No ascites. Musculoskeletal: No acute or significant osseous findings. IMPRESSION: 1. Cholelithiasis without evidence of acute cholecystitis; gallbladder is distended by innumerable tiny calculi without gallbladder wall thickening or pericholecystic fluid. 2. Choledocholithiasis; at least one small gallstone in the central common bile duct near the ampulla measuring 0.4 cm, possibly additional tiny calculi or gravel. Common bile duct is nondistended. There may be minimal intrahepatic biliary ductal dilatation. Electronically Signed   By: Jearld Lesch M.D.   On: 06/26/2023 14:15   US Abdomen Limited RUQ (LIVER/GB) Result Date: 06/26/2023 CLINICAL DATA:  Right upper quadrant abdominal pain EXAM: ULTRASOUND ABDOMEN LIMITED RIGHT UPPER QUADRANT COMPARISON:  CT 06/25/2023 FINDINGS: Gallbladder: Dilated gallbladder. Slight gallbladder wall thickening of 5 mm. Echogenic appearing wall with shadowed areas of the gallbladder. This could be a gallbladder full of stones but not classic in appearance. Common bile duct: Diameter: 4 mm Liver: No focal lesion identified. Within normal limits in parenchymal echogenicity. Portal vein is patent on color Doppler imaging with normal direction of blood flow towards the liver. Other: Evaluation limited with overlapping bowel gas and soft tissue. IMPRESSION: Dilated gallbladder with question of a gallbladder full  of stones but there overall limitations and the appearance is not classic. Recommend further workup with MRCP to further delineate. No ductal dilatation. Electronically Signed   By: Karen Kays M.D.   On: 06/26/2023 10:21   CT ABDOMEN PELVIS W CONTRAST Result Date: 06/26/2023 CLINICAL DATA:  Intermittent abdominal pain and nausea. EXAM: CT ABDOMEN AND PELVIS WITH CONTRAST TECHNIQUE: Multidetector CT imaging of the abdomen and pelvis was performed using the standard protocol following bolus administration of intravenous contrast. RADIATION DOSE REDUCTION: This exam was performed according to the departmental dose-optimization program which includes automated exposure control, adjustment of the mA and/or kV according to patient size and/or use of iterative reconstruction technique. CONTRAST:  OMNIPAQUE IOHEXOL 300 MG/ML  SOLN COMPARISON:  None Available. FINDINGS: Lower chest: No acute abnormality. Hepatobiliary: No focal liver abnormality is seen. A moderate amount of sludge is suspected within a distended gallbladder, without evidence of gallstones, gallbladder wall thickening, or biliary dilatation. Pancreas: Unremarkable. No pancreatic ductal dilatation or surrounding inflammatory changes. Spleen: Normal in size without focal abnormality. Adrenals/Urinary Tract: Adrenal glands are unremarkable. Kidneys are normal, without obstructing renal calculi, focal lesion, or hydronephrosis. A 3 mm nonobstructing renal calculus is seen within the upper pole of the right kidney. Bladder is unremarkable. Stomach/Bowel: Stomach is within normal limits. Appendix appears normal. No evidence of bowel wall thickening, distention, or inflammatory changes. Noninflamed diverticula are seen throughout the sigmoid colon Vascular/Lymphatic: No significant vascular findings are present. No enlarged abdominal or  pelvic lymph nodes. Reproductive: Uterus and bilateral adnexa are unremarkable. Other: No abdominal wall hernia or  abnormality. No abdominopelvic ascites. Musculoskeletal: No acute or significant osseous findings. IMPRESSION: 1. Moderate amount of sludge within a distended gallbladder. Correlation with nonemergent right upper quadrant ultrasound is recommended. 2. 3 mm nonobstructing right renal calculus. 3. Sigmoid diverticulosis. Electronically Signed   By: Aram Candela M.D.   On: 06/26/2023 00:24       LOS: 1 day   Osvaldo Shipper  [facility] Pager on [email or web address]  06/27/2023, 11:14 AM

## 2023-06-27 NOTE — Transfer of Care (Signed)
 Immediate Anesthesia Transfer of Care Note  Patient: Tracy Mcgee  Procedure(s) Performed: ERCP, WITH INTERVENTION IF INDICATED SPHINCTEROTOMY ERCP, WITH LITHROTRIPSY OR REMOVAL OF COMMON BILE DUCT CALCULUS  Patient Location: PACU  Anesthesia Type:General  Level of Consciousness: awake and alert   Airway & Oxygen Therapy: Patient Spontanous Breathing and Patient connected to nasal cannula oxygen  Post-op Assessment: Report given to RN and Post -op Vital signs reviewed and stable  Post vital signs: Reviewed and stable  Last Vitals:  Vitals Value Taken Time  BP 120/76   Temp 98.1   Pulse 76 06/27/23 1430  Resp 14 06/27/23 1430  SpO2 97 % 06/27/23 1430  Vitals shown include unfiled device data.  Last Pain:  Vitals:   06/27/23 1211  TempSrc: Tympanic  PainSc: 0-No pain         Complications: No notable events documented.

## 2023-06-27 NOTE — Consult Note (Addendum)
 Consultation Note   Referring Provider:  [facility] PCP: Donita Brooks, MD Primary Gastroenterologist::  Unassigned        Reason for Consultation:  Bile duct stone DOA: 06/25/2023         Hospital Day: 3   ASSESSMENT    58 y.o. year old female with elevated LFTs, cholelithiasis / choledocholithiasis  Diverticulosis CKD 3 Hypertension Renal stones See PMH for additional history     PLAN:   --Schedule for ERCP for bile duct stone removal. The benefits and risks of ERCP with possible sphincterotomy not limited to cardiopulmonary complications of sedation, bleeding, infection, perforation,and pancreatitis were discussed with the patient who agrees to proceed.   --Keep NPO in case procedure can be done today. Trying to coordinate time with Anesthesia --Will need cholecystectomy following bile duct stone removal.  --Eventual outpatient screening colonoscopy   -----------------------------------------------------------------------------------------------------------------------  Gastroenterology attending physician:  I have seen and evaluated the patient as well.  Majority of high-level complex medical decision making performed by me.  Abdominal exam is benign at this time.  LFTs improving.  MRCP yesterday, images reviewed by myself personally, demonstrated choledocholithiasis.  Plan is for ERCP with biliary sphincterotomy and stone extraction today.  I reviewed the risks benefits indications of the procedure with the patient she understands and agrees to proceed.  Iva Boop, MD, [facility]enter[facility] See Loretha Stapler on call - gastroenterology for best contact person 06/27/2023 12:47 PM   HPI   Patient presented to [facility], ED early yesterday morning with acute upper abdominal pain, nausea and vomiting.  Thought the epigastric pain was reflux at first. IN ED her LFTs were elevated.  Found to have cholelithiasis  on ultrasound.  General surgery evaluated and ordered MRCP which was remarkable for cholelithiasis without acute cholecystitis, choledocholithiasis with possible minimal intrahepatic biliary duct dilation.  We were consulted for transfer for ERCP.  Patient was transferred to [facility]  Overnight her liver enzymes have significantly improved.  Bilirubin is stable at 1.9 and alkaline phosphatase continues to remain normal.  White count is normal.   Tracy Mcgee has no chronic GI problems. No prior surgeries   Labs and Imaging:  Recent Labs    06/25/23 2232 06/26/23 0529 06/27/23 0433  WBC 8.7 7.0 5.7  HGB 14.5 14.1 13.0  HCT 42.6 42.3 38.3  MCV 90.1 91.2 91.0  PLT 301 301 253   No results for input(s): "FOLATE", "VITAMINB12", "FERRITIN", "TIBC", "IRONPCTSAT" in the last 72 hours. Recent Labs    06/25/23 2232 06/26/23 0529 06/27/23 0433  NA 136 138 138  K 4.1 4.5 4.3  CL 101 103 106  CO2 25 26 27   GLUCOSE 141* 108* 90  BUN 16 14 13   CREATININE 1.05* 1.00 1.07*  CALCIUM 9.8 9.9 8.9   Recent Labs    06/25/23 2232 06/26/23 0529 06/27/23 0433  PROT 8.4* 7.8 6.7  ALBUMIN 4.1 3.9 3.3*  AST 543* 707* 202*  ALT 313* 553* 341*  ALKPHOS 58 62 59  BILITOT 1.5* 2.0* 1.9*   Recent Labs    06/25/23 2232  INR 0.9   No results for input(s): "AFPTUMOR" in the last 72 hours.  MR 3D Recon At Scanner CLINICAL DATA:  Elevated  liver enzymes, right upper quadrant pain  EXAM: MRI ABDOMEN WITHOUT AND WITH CONTRAST (INCLUDING MRCP)  TECHNIQUE: Multiplanar multisequence MR imaging of the abdomen was performed both before and after the administration of intravenous contrast. Heavily T2-weighted images of the biliary and pancreatic ducts were obtained, and three-dimensional MRCP images were rendered by post processing.  CONTRAST:  10mL GADAVIST GADOBUTROL 1 MMOL/ML IV SOLN  COMPARISON:  CT abdomen pelvis, 06/25/2023  FINDINGS: Lower chest: No acute abnormality.  Hepatobiliary: No solid  liver abnormality is seen. Gallbladder is distended by innumerable tiny calculi (series 4, image 22). No gallbladder wall thickening. At least one small gallstone in the central common bile duct near the ampulla measuring 0.4 cm (series 4, image 23), possibly additional tiny calculi or gravel. Common bile duct is nondistended. There may be minimal intrahepatic biliary ductal dilatation.  Pancreas: Unremarkable. No pancreatic ductal dilatation or surrounding inflammatory changes.  Spleen: Normal in size without significant abnormality.  Adrenals/Urinary Tract: Adrenal glands are unremarkable. Kidneys are normal, without renal calculi, solid lesion, or hydronephrosis.  Stomach/Bowel: Stomach is within normal limits. No evidence of bowel wall thickening, distention, or inflammatory changes.  Vascular/Lymphatic: No significant vascular findings are present. No enlarged abdominal lymph nodes.  Other: No abdominal wall hernia or abnormality. No ascites.  Musculoskeletal: No acute or significant osseous findings.  IMPRESSION: 1. Cholelithiasis without evidence of acute cholecystitis; gallbladder is distended by innumerable tiny calculi without gallbladder wall thickening or pericholecystic fluid. 2. Choledocholithiasis; at least one small gallstone in the central common bile duct near the ampulla measuring 0.4 cm, possibly additional tiny calculi or gravel. Common bile duct is nondistended. There may be minimal intrahepatic biliary ductal dilatation.  Electronically Signed   By: Jearld Lesch M.D.   On: 06/26/2023 14:15 MR ABDOMEN MRCP W WO CONTAST CLINICAL DATA:  Elevated liver enzymes, right upper quadrant pain  EXAM: MRI ABDOMEN WITHOUT AND WITH CONTRAST (INCLUDING MRCP)  TECHNIQUE: Multiplanar multisequence MR imaging of the abdomen was performed both before and after the administration of intravenous contrast. Heavily T2-weighted images of the biliary and pancreatic ducts  were obtained, and three-dimensional MRCP images were rendered by post processing.  CONTRAST:  10mL GADAVIST GADOBUTROL 1 MMOL/ML IV SOLN  COMPARISON:  CT abdomen pelvis, 06/25/2023  FINDINGS: Lower chest: No acute abnormality.  Hepatobiliary: No solid liver abnormality is seen. Gallbladder is distended by innumerable tiny calculi (series 4, image 22). No gallbladder wall thickening. At least one small gallstone in the central common bile duct near the ampulla measuring 0.4 cm (series 4, image 23), possibly additional tiny calculi or gravel. Common bile duct is nondistended. There may be minimal intrahepatic biliary ductal dilatation.  Pancreas: Unremarkable. No pancreatic ductal dilatation or surrounding inflammatory changes.  Spleen: Normal in size without significant abnormality.  Adrenals/Urinary Tract: Adrenal glands are unremarkable. Kidneys are normal, without renal calculi, solid lesion, or hydronephrosis.  Stomach/Bowel: Stomach is within normal limits. No evidence of bowel wall thickening, distention, or inflammatory changes.  Vascular/Lymphatic: No significant vascular findings are present. No enlarged abdominal lymph nodes.  Other: No abdominal wall hernia or abnormality. No ascites.  Musculoskeletal: No acute or significant osseous findings.  IMPRESSION: 1. Cholelithiasis without evidence of acute cholecystitis; gallbladder is distended by innumerable tiny calculi without gallbladder wall thickening or pericholecystic fluid. 2. Choledocholithiasis; at least one small gallstone in the central common bile duct near the ampulla measuring 0.4 cm, possibly additional tiny calculi or gravel. Common bile duct is nondistended. There may be minimal intrahepatic  biliary ductal dilatation.  Electronically Signed   By: Jearld Lesch M.D.   On: 06/26/2023 14:15 US Abdomen Limited RUQ (LIVER/GB) CLINICAL DATA:  Right upper quadrant abdominal pain  EXAM: ULTRASOUND  ABDOMEN LIMITED RIGHT UPPER QUADRANT  COMPARISON:  CT 06/25/2023  FINDINGS: Gallbladder:  Dilated gallbladder. Slight gallbladder wall thickening of 5 mm. Echogenic appearing wall with shadowed areas of the gallbladder. This could be a gallbladder full of stones but not classic in appearance.  Common bile duct:  Diameter: 4 mm  Liver:  No focal lesion identified. Within normal limits in parenchymal echogenicity. Portal vein is patent on color Doppler imaging with normal direction of blood flow towards the liver.  Other: Evaluation limited with overlapping bowel gas and soft tissue.  IMPRESSION: Dilated gallbladder with question of a gallbladder full of stones but there overall limitations and the appearance is not classic. Recommend further workup with MRCP to further delineate. No ductal dilatation.  Electronically Signed   By: Karen Kays M.D.   On: 06/26/2023 10:21 CT ABDOMEN PELVIS W CONTRAST CLINICAL DATA:  Intermittent abdominal pain and nausea.  EXAM: CT ABDOMEN AND PELVIS WITH CONTRAST  TECHNIQUE: Multidetector CT imaging of the abdomen and pelvis was performed using the standard protocol following bolus administration of intravenous contrast.  RADIATION DOSE REDUCTION: This exam was performed according to the departmental dose-optimization program which includes automated exposure control, adjustment of the mA and/or kV according to patient size and/or use of iterative reconstruction technique.  CONTRAST:  OMNIPAQUE IOHEXOL 300 MG/ML  SOLN  COMPARISON:  None Available.  FINDINGS: Lower chest: No acute abnormality.  Hepatobiliary: No focal liver abnormality is seen. A moderate amount of sludge is suspected within a distended gallbladder, without evidence of gallstones, gallbladder wall thickening, or biliary dilatation.  Pancreas: Unremarkable. No pancreatic ductal dilatation or surrounding inflammatory changes.  Spleen: Normal in size  without focal abnormality.  Adrenals/Urinary Tract: Adrenal glands are unremarkable. Kidneys are normal, without obstructing renal calculi, focal lesion, or hydronephrosis. A 3 mm nonobstructing renal calculus is seen within the upper pole of the right kidney. Bladder is unremarkable.  Stomach/Bowel: Stomach is within normal limits. Appendix appears normal. No evidence of bowel wall thickening, distention, or inflammatory changes. Noninflamed diverticula are seen throughout the sigmoid colon  Vascular/Lymphatic: No significant vascular findings are present. No enlarged abdominal or pelvic lymph nodes.  Reproductive: Uterus and bilateral adnexa are unremarkable.  Other: No abdominal wall hernia or abnormality. No abdominopelvic ascites.  Musculoskeletal: No acute or significant osseous findings.  IMPRESSION: 1. Moderate amount of sludge within a distended gallbladder. Correlation with nonemergent right upper quadrant ultrasound is recommended. 2. 3 mm nonobstructing right renal calculus. 3. Sigmoid diverticulosis.  Electronically Signed   By: Aram Candela M.D.   On: 06/26/2023 00:24   Previous GI Evaluations      Past Medical History:  Diagnosis Date   Arthritis    CKD (chronic kidney disease) stage 3, GFR 30-59 ml/min (HCC)    Hypertension     History reviewed. No pertinent surgical history.  Family History  Problem Relation Age of Onset   Colitis Mother    Heart disease Father    Heart disease Brother    Crohn's disease Son    Alzheimer's disease Maternal Grandmother    Leukemia Paternal Grandmother    Heart disease Paternal Grandfather     Prior to Admission medications   Medication Sig Start Date End Date Taking? Authorizing Provider  Multiple Vitamins-Minerals (MULTIVITAMIN WITH MINERALS)  tablet Take 1 tablet by mouth daily.   Yes [provider]  valsartan (DIOVAN) 80 MG tablet Take 1 tablet (80 mg total) by mouth daily. 05/15/23  Yes  Donita Brooks, MD    Current Facility-Administered Medications  Medication Dose Route Frequency Provider Last Rate Last Admin   acetaminophen (TYLENOL) tablet 650 mg  650 mg Oral Q6H PRN Mansy, Jan A, MD       Or   acetaminophen (TYLENOL) suppository 650 mg  650 mg Rectal Q6H PRN Mansy, Jan A, MD       enoxaparin (LOVENOX) injection 40 mg  40 mg Subcutaneous Q24H Mansy, Jan A, MD   40 mg at 06/26/23 4098   irbesartan (AVAPRO) tablet 75 mg  75 mg Oral Daily Mansy, Jan A, MD   75 mg at 06/26/23 0851   magnesium hydroxide (MILK OF MAGNESIA) suspension 30 mL  30 mL Oral Daily PRN Mansy, Jan A, MD       morphine (PF) 2 MG/ML injection 2 mg  2 mg Intravenous Q4H PRN Mansy, Jan A, MD       multivitamin with minerals tablet 1 tablet  1 tablet Oral Daily Mansy, Jan A, MD   1 tablet at 06/26/23 0851   ondansetron (ZOFRAN) tablet 4 mg  4 mg Oral Q6H PRN Mansy, Jan A, MD       Or   ondansetron [facility]) injection 4 mg  4 mg Intravenous Q6H PRN Mansy, Jan A, MD       traZODone (DESYREL) tablet 25 mg  25 mg Oral QHS PRN Mansy, Vernetta Honey, MD        Allergies as of 06/25/2023   (No Known Allergies)    Social History   Socioeconomic History   Marital status: Married    Spouse name: Not on file   Number of children: Not on file   Years of education: Not on file   Highest education level: Associate degree: occupational, Scientist, product/process development, or vocational program  Occupational History   Not on file  Tobacco Use   Smoking status: Never   Smokeless tobacco: Never  Substance and Sexual Activity   Alcohol use: No   Drug use: No   Sexual activity: Yes  Other Topics Concern   Not on file  Social History Narrative   Not on file   Social Drivers of Health   Financial Resource Strain: Low Risk  (04/03/2023)   Overall Financial Resource Strain (CARDIA)    Difficulty of Paying Living Expenses: Not hard at all  Food Insecurity: No Food Insecurity (06/26/2023)   Hunger Vital Sign    Worried About Running Out  of Food in the Last Year: Never true    Ran Out of Food in the Last Year: Never true  Transportation Needs: No Transportation Needs (06/26/2023)   PRAPARE - Administrator, Civil Service (Medical): No    Lack of Transportation (Non-Medical): No  Physical Activity: Insufficiently Active (04/03/2023)   Exercise Vital Sign    Days of Exercise per Week: 1 day    Minutes of Exercise per Session: 10 min  Stress: No Stress Concern Present (04/03/2023)   [employer] of Occupational Health - Occupational Stress Questionnaire    Feeling of Stress : Not at all  Social Connections: Socially Integrated (04/03/2023)   Social Connection and Isolation Panel [NHANES]    Frequency of Communication with Friends and Family: Twice a week    Frequency of Social Gatherings with Friends and  Family: Once a week    Attends Religious Services: More than 4 times per year    Active Member of Clubs or Organizations: Yes    Attends Banker Meetings: More than 4 times per year    Marital Status: Married  Catering manager Violence: Not At Risk (06/26/2023)   Humiliation, Afraid, Rape, and Kick questionnaire    Fear of Current or Ex-Partner: No    Emotionally Abused: No    Physically Abused: No    Sexually Abused: No     Code Status   Code Status: Full Code  Review of Systems: All systems reviewed and negative except where noted in HPI.  Physical Exam: Vital signs in last 24 hours: Temp:  [97.8 F (36.6 C)-98.7 F (37.1 C)] 98 F (36.7 C) (03/20 0754) Pulse Rate:  [58-95] 95 (03/20 0754) Resp:  [11-20] 18 (03/20 0509) BP: (100-154)/(69-98) 133/98 (03/20 0754) SpO2:  [93 %-96 %] 96 % (03/20 0754)    General:  Pleasant female in NAD Psych:  Cooperative. Normal mood and affect Eyes: Pupils equal Ears:  Normal auditory acuity Nose: No deformity, discharge or lesions Neck:  Supple, no masses felt Lungs:  Clear to auscultation.  Heart:  Regular rate, regular rhythm.   Abdomen:  Soft, nondistended, nontender, active bowel sounds, no masses felt Rectal :  Deferred Msk: Symmetrical without gross deformities.  Neurologic:  Alert, oriented, grossly normal neurologically Extremities : No edema Skin:  Intact without significant lesions.    Intake/Output from previous day: No intake/output data recorded. Intake/Output this shift:  No intake/output data recorded.   Willette Cluster, NP-C   06/27/2023, 8:55 AM

## 2023-06-27 NOTE — Anesthesia Procedure Notes (Signed)
 Procedure Name: Intubation Date/Time: 06/27/2023 1:17 PM  Performed by: Nadara Mustard, CRNAPre-anesthesia Checklist: Patient identified, Emergency Drugs available, Suction available and Patient being monitored Patient Re-evaluated:Patient Re-evaluated prior to induction Oxygen Delivery Method: Circle system utilized Preoxygenation: Pre-oxygenation with 100% oxygen Induction Type: IV induction Ventilation: Mask ventilation without difficulty Tube type: Oral Number of attempts: 1 Airway Equipment and Method: Stylet and Oral airway Placement Confirmation: ETT inserted through vocal cords under direct vision, positive ETCO2 and breath sounds checked- equal and bilateral Tube secured with: Tape Dental Injury: Teeth and Oropharynx as per pre-operative assessment

## 2023-06-27 NOTE — Op Note (Signed)
 [facility] Patient Name: Tracy Mcgee Procedure Date : 06/27/2023 MRN: [id number] Attending MD: Iva Boop , MD, [id number] Date of Birth: 10-01-1965 CSN: [id number] Age: 58 Admit Type: Inpatient Procedure:                ERCP Indications:              Bile duct stone(s), For therapy of bile duct                            stone(s) Providers:                Iva Boop, MD, Stephens Shire RN, RN, Rhodia Albright, Technician Referring MD:              Medicines:                General Anesthesia, Unasyn 3 g IV, Indomethacin 100                            mg PR Complications:            No immediate complications. Estimated Blood Loss:     Estimated blood loss: none. Procedure:                Pre-Anesthesia Assessment:                           - Prior to the procedure, a History and Physical                            was performed, and patient medications and                            allergies were reviewed. The patient's tolerance of                            previous anesthesia was also reviewed. The risks                            and benefits of the procedure and the sedation                            options and risks were discussed with the patient.                            All questions were answered, and informed consent                            was obtained. Prior Anticoagulants: The patient                            last took Lovenox (enoxaparin) 1 day prior to the  procedure. ASA Grade Assessment: II - A patient                            with mild systemic disease. After reviewing the                            risks and benefits, the patient was deemed in                            satisfactory condition to undergo the procedure.                           After obtaining informed consent, the scope was                            passed under direct vision. Throughout the                             procedure, the patient's blood pressure, pulse, and                            oxygen saturations were monitored continuously. The                            [employer] D single use                            duodenoscope was introduced through the mouth, and                            used to inject contrast into and used to inject                            contrast into the bile duct. The ERCP was                            technically difficult and complex due to                            challenging cannulation. Successful completion of                            the procedure was aided by using smaller                            sphincterotome. The patient tolerated the procedure                            well. Scope In: Scope Out: Findings:      The scout film was normal. The esophagus was successfully intubated       under direct vision. The scope was advanced to a small, inflamed but       otherwise normal major papilla in the descending duodenum without  detailed examination of the pharynx, larynx and associated structures,       and upper GI tract. The upper GI tract was grossly normal. The bile duct       was deeply cannulated. During this attempt tiny pigmented stones exited       the papilla. Initial attempts with standarrd 035 wire sphincterotoe were       not successful - so changed to the 025 wire tapered sphincterotome and       after some additional attempts the wire passed into the bile duct.       Contrast was injected. I personally interpreted the bile duct images.       Ductal flow of contrast was adequate. The common bile duct contained       multiple stones, the largest of which was 5 mm in diameter. The       intra-hepatic and extra-hepatic biliary duct system was otherwise       normal. Gallbladder partially filled and numerous stones seen. A 4-5 mm       biliary sphincterotomy was made with a tapered sphincterotome using ERBE        electrocautery. There was no post-sphincterotomy bleeding. To discover       objects, the biliary tree was swept with a 9 mm balloon starting at the       bifurcation. A few stones were removed. No stones remained. Impression:               - Choledocholithiasis was found. Complete removal                            was accomplished by biliary sphincterotomy and                            balloon extraction. Required tapered 025 wire                            sphincterotome to cannulate                           - Cholelithiasis                           - Grossly normal upper GI tract Recommendation:           - Clear liquids then NPO at midnight                           lap chole tomorrow per surgery Procedure Code(s):        --- Professional ---                           [phone], Endoscopic retrograde                            cholangiopancreatography (ERCP); with removal of                            calculi/debris from biliary/pancreatic duct(s)  40102, Endoscopic retrograde                            cholangiopancreatography (ERCP); with                            sphincterotomy/papillotomy                           [phone], Endoscopic catheterization of the biliary                            ductal system, radiological supervision and                            interpretation Diagnosis Code(s):        --- Professional ---                           K80.50, Calculus of bile duct without cholangitis                            or cholecystitis without obstruction CPT copyright 2022 American Medical Association. All rights reserved. The codes documented in this report are preliminary and upon coder review may  be revised to meet current compliance requirements. Iva Boop, MD 06/27/2023 2:36:27 PM This report has been signed electronically. Number of Addenda: 0

## 2023-06-28 ENCOUNTER — Inpatient Hospital Stay (HOSPITAL_COMMUNITY): Admitting: Certified Registered"

## 2023-06-28 ENCOUNTER — Encounter (HOSPITAL_COMMUNITY): Payer: Self-pay | Admitting: Internal Medicine

## 2023-06-28 ENCOUNTER — Encounter (HOSPITAL_COMMUNITY)
Admission: EM | Disposition: A | Discharge status: Home / Self Care | Payer: Self-pay | Source: Home / Self Care | Attending: Student

## 2023-06-28 DIAGNOSIS — I10 Essential (primary) hypertension: Secondary | ICD-10-CM | POA: Diagnosis not present

## 2023-06-28 DIAGNOSIS — R1011 Right upper quadrant pain: Secondary | ICD-10-CM | POA: Diagnosis not present

## 2023-06-28 DIAGNOSIS — K8050 Calculus of bile duct without cholangitis or cholecystitis without obstruction: Secondary | ICD-10-CM | POA: Diagnosis not present

## 2023-06-28 DIAGNOSIS — R7989 Other specified abnormal findings of blood chemistry: Secondary | ICD-10-CM | POA: Diagnosis not present

## 2023-06-28 HISTORY — PX: CHOLECYSTECTOMY: SHX55

## 2023-06-28 LAB — CBC
HCT: 37.6 % (ref 36.0–46.0)
Hemoglobin: 13.1 g/dL (ref 12.0–15.0)
MCH: 30.4 pg (ref 26.0–34.0)
MCHC: 34.8 g/dL (ref 30.0–36.0)
MCV: 87.2 fL (ref 80.0–100.0)
Platelets: 288 10*3/uL (ref 150–400)
RBC: 4.31 MIL/uL (ref 3.87–5.11)
RDW: 12.3 % (ref 11.5–15.5)
WBC: 6.4 10*3/uL (ref 4.0–10.5)
nRBC: 0 % (ref 0.0–0.2)

## 2023-06-28 LAB — COMPREHENSIVE METABOLIC PANEL
ALT: 227 U/L — ABNORMAL HIGH (ref 0–44)
AST: 72 U/L — ABNORMAL HIGH (ref 15–41)
Albumin: 3.3 g/dL — ABNORMAL LOW (ref 3.5–5.0)
Alkaline Phosphatase: 52 U/L (ref 38–126)
Anion gap: 7 (ref 5–15)
BUN: 13 mg/dL (ref 6–20)
CO2: 24 mmol/L (ref 22–32)
Calcium: 8.8 mg/dL — ABNORMAL LOW (ref 8.9–10.3)
Chloride: 108 mmol/L (ref 98–111)
Creatinine, Ser: 1.07 mg/dL — ABNORMAL HIGH (ref 0.44–1.00)
GFR, Estimated: >60 mL/min (ref >60)
Glucose, Bld: 130 mg/dL — ABNORMAL HIGH (ref 70–99)
Potassium: 4.4 mmol/L (ref 3.5–5.1)
Sodium: 139 mmol/L (ref 135–145)
Total Bilirubin: 0.9 mg/dL (ref 0.0–1.2)
Total Protein: 6.9 g/dL (ref 6.5–8.1)

## 2023-06-28 SURGERY — LAPAROSCOPIC CHOLECYSTECTOMY
Anesthesia: General | Site: Abdomen

## 2023-06-28 MED ORDER — CHLORHEXIDINE GLUCONATE 0.12 % MT SOLN: 15.0000 mL | Freq: Once | OROMUCOSAL | Status: AC

## 2023-06-28 MED ORDER — MIDAZOLAM HCL 2 MG/2ML IJ SOLN
INTRAMUSCULAR | Status: DC | PRN
  Administered 2023-06-28: 2 mg via INTRAVENOUS

## 2023-06-28 MED ORDER — ORAL CARE MOUTH RINSE: 15.0000 mL | Freq: Once | OROMUCOSAL | Status: AC

## 2023-06-28 MED ORDER — HYDROMORPHONE HCL 1 MG/ML IJ SOLN
INTRAMUSCULAR | Status: AC
  Filled 2023-06-28: qty 1

## 2023-06-28 MED ORDER — ROCURONIUM BROMIDE 10 MG/ML (PF) SYRINGE
PREFILLED_SYRINGE | INTRAVENOUS | Status: DC | PRN
  Administered 2023-06-28: 60 mg via INTRAVENOUS

## 2023-06-28 MED ORDER — BUPIVACAINE-EPINEPHRINE 0.25% -1:200000 IJ SOLN
INTRAMUSCULAR | Status: DC | PRN
  Administered 2023-06-28: 30 mL

## 2023-06-28 MED ORDER — MIDAZOLAM HCL 2 MG/2ML IJ SOLN
INTRAMUSCULAR | Status: AC
  Filled 2023-06-28: qty 2

## 2023-06-28 MED ORDER — ACETAMINOPHEN 500 MG PO TABS
1000.0000 mg | ORAL_TABLET | Freq: Four times a day (QID) | ORAL | Status: DC
  Administered 2023-06-28 – 2023-06-29 (×4): 1000 mg via ORAL
  Filled 2023-06-28 (×4): qty 2

## 2023-06-28 MED ORDER — PROPOFOL 10 MG/ML IV BOLUS
INTRAVENOUS | Status: AC
  Filled 2023-06-28: qty 20

## 2023-06-28 MED ORDER — OXYCODONE HCL 5 MG/5ML PO SOLN: 5.0000 mg | Freq: Once | ORAL | Status: AC | PRN

## 2023-06-28 MED ORDER — OXYCODONE HCL 5 MG PO TABS
5.0000 mg | ORAL_TABLET | ORAL | Status: DC | PRN
  Administered 2023-06-28 – 2023-06-29 (×3): 5 mg via ORAL
  Filled 2023-06-28 (×3): qty 1

## 2023-06-28 MED ORDER — DROPERIDOL 2.5 MG/ML IJ SOLN: 0.6250 mg | Freq: Once | INTRAMUSCULAR | Status: DC | PRN

## 2023-06-28 MED ORDER — OXYCODONE HCL 5 MG PO TABS
5.0000 mg | ORAL_TABLET | Freq: Once | ORAL | Status: AC | PRN
  Administered 2023-06-28: 5 mg via ORAL

## 2023-06-28 MED ORDER — FENTANYL CITRATE (PF) 250 MCG/5ML IJ SOLN
INTRAMUSCULAR | Status: AC
  Filled 2023-06-28: qty 5

## 2023-06-28 MED ORDER — PHENOL 1.4 % MT LIQD
1.0000 | OROMUCOSAL | Status: DC | PRN
  Filled 2023-06-28: qty 177

## 2023-06-28 MED ORDER — DEXAMETHASONE SODIUM PHOSPHATE 10 MG/ML IJ SOLN
INTRAMUSCULAR | Status: DC | PRN
  Administered 2023-06-28: 10 mg via INTRAVENOUS

## 2023-06-28 MED ORDER — FENTANYL CITRATE (PF) 250 MCG/5ML IJ SOLN
INTRAMUSCULAR | Status: DC | PRN
  Administered 2023-06-28 (×2): 50 ug via INTRAVENOUS
  Administered 2023-06-28: 75 ug via INTRAVENOUS
  Administered 2023-06-28: 25 ug via INTRAVENOUS
  Administered 2023-06-28: 50 ug via INTRAVENOUS

## 2023-06-28 MED ORDER — INDOCYANINE GREEN 25 MG IV SOLR
2.5000 mg | Freq: Once | INTRAVENOUS | Status: AC
  Administered 2023-06-28: 2.5 mg via INTRAVENOUS

## 2023-06-28 MED ORDER — CHLORHEXIDINE GLUCONATE 0.12 % MT SOLN
OROMUCOSAL | Status: AC
  Administered 2023-06-28: 15 mL via OROMUCOSAL
  Filled 2023-06-28: qty 15

## 2023-06-28 MED ORDER — MORPHINE SULFATE (PF) 2 MG/ML IV SOLN
2.0000 mg | INTRAVENOUS | Status: DC | PRN
  Administered 2023-06-28: 2 mg via INTRAVENOUS
  Filled 2023-06-28: qty 1

## 2023-06-28 MED ORDER — CEFAZOLIN SODIUM-DEXTROSE 2-3 GM-%(50ML) IV SOLR
INTRAVENOUS | Status: DC | PRN
  Administered 2023-06-28: 2 g via INTRAVENOUS

## 2023-06-28 MED ORDER — HYDROMORPHONE HCL 1 MG/ML IJ SOLN
0.2500 mg | INTRAMUSCULAR | Status: DC | PRN
  Administered 2023-06-28: 0.5 mg via INTRAVENOUS

## 2023-06-28 MED ORDER — DEXMEDETOMIDINE HCL IN NACL 80 MCG/20ML IV SOLN
INTRAVENOUS | Status: DC | PRN
  Administered 2023-06-28: 8 ug via INTRAVENOUS

## 2023-06-28 MED ORDER — PROPOFOL 10 MG/ML IV BOLUS
INTRAVENOUS | Status: DC | PRN
  Administered 2023-06-28: 100 mg via INTRAVENOUS
  Administered 2023-06-28: 80 mg via INTRAVENOUS

## 2023-06-28 MED ORDER — LIDOCAINE 2% (20 MG/ML) 5 ML SYRINGE
INTRAMUSCULAR | Status: DC | PRN
  Administered 2023-06-28: 100 mg via INTRAVENOUS

## 2023-06-28 MED ORDER — SODIUM CHLORIDE 0.9 % IR SOLN
Status: DC | PRN
  Administered 2023-06-28: 1

## 2023-06-28 MED ORDER — LACTATED RINGERS IV SOLN: INTRAVENOUS | Status: DC

## 2023-06-28 MED ORDER — CEFAZOLIN SODIUM-DEXTROSE 2-4 GM/100ML-% IV SOLN
INTRAVENOUS | Status: AC
Start: 2023-06-28 — End: 2023-06-29
  Filled 2023-06-28: qty 100

## 2023-06-28 MED ORDER — SUGAMMADEX SODIUM 200 MG/2ML IV SOLN
INTRAVENOUS | Status: DC | PRN
  Administered 2023-06-28 (×2): 213.2 mg via INTRAVENOUS

## 2023-06-28 MED ORDER — HYDRALAZINE HCL 20 MG/ML IJ SOLN: 5.0000 mg | INTRAMUSCULAR | Status: DC | PRN

## 2023-06-28 MED ORDER — ONDANSETRON HCL 4 MG/2ML IJ SOLN
INTRAMUSCULAR | Status: DC | PRN
  Administered 2023-06-28 (×2): 4 mg via INTRAVENOUS

## 2023-06-28 MED ORDER — KETOROLAC TROMETHAMINE 30 MG/ML IJ SOLN: 30.0000 mg | Freq: Once | INTRAMUSCULAR | Status: DC | PRN

## 2023-06-28 MED ORDER — OXYCODONE HCL 5 MG PO TABS
ORAL_TABLET | ORAL | Status: AC
  Filled 2023-06-28: qty 1

## 2023-06-28 MED ORDER — ONDANSETRON HCL 4 MG/2ML IJ SOLN: 4.0000 mg | Freq: Once | INTRAMUSCULAR | Status: DC | PRN

## 2023-06-28 SURGICAL SUPPLY — 33 items
CANISTER SUCT 3000ML PPV (MISCELLANEOUS) ×1 IMPLANT
CHLORAPREP W/TINT 26 (MISCELLANEOUS) ×1 IMPLANT
CLIP LIGATING HEMO O LOK GREEN (MISCELLANEOUS) ×1 IMPLANT
COVER SURGICAL LIGHT HANDLE (MISCELLANEOUS) ×1 IMPLANT
DERMABOND ADVANCED .7 DNX12 (GAUZE/BANDAGES/DRESSINGS) ×1 IMPLANT
ELECT REM PT RETURN 9FT ADLT (ELECTROSURGICAL) ×1 IMPLANT
ELECTRODE REM PT RTRN 9FT ADLT (ELECTROSURGICAL) ×1 IMPLANT
GLOVE BIOGEL PI IND STRL 7.0 (GLOVE) ×1 IMPLANT
GLOVE SURG SS PI 7.0 STRL IVOR (GLOVE) ×1 IMPLANT
GOWN STRL REUS W/ TWL LRG LVL3 (GOWN DISPOSABLE) ×3 IMPLANT
GRASPER SUT TROCAR 14GX15 (MISCELLANEOUS) ×1 IMPLANT
IRRIG SUCT STRYKERFLOW 2 WTIP (MISCELLANEOUS) ×1 IMPLANT
IRRIGATION SUCT STRKRFLW 2 WTP (MISCELLANEOUS) ×1 IMPLANT
KIT BASIN OR (CUSTOM PROCEDURE TRAY) ×1 IMPLANT
KIT TURNOVER KIT B (KITS) ×1 IMPLANT
NDL 22X1.5 STRL (OR ONLY) (MISCELLANEOUS) ×1 IMPLANT
NDL HYPO 22X1.5 SAFETY MO (MISCELLANEOUS) ×1 IMPLANT
NEEDLE 22X1.5 STRL (OR ONLY) (MISCELLANEOUS) ×1 IMPLANT
NEEDLE HYPO 22X1.5 SAFETY MO (MISCELLANEOUS) ×1 IMPLANT
NS IRRIG 1000ML POUR BTL (IV SOLUTION) ×1 IMPLANT
PAD ARMBOARD POSITIONER FOAM (MISCELLANEOUS) ×1 IMPLANT
POUCH RETRIEVAL ECOSAC 10 (ENDOMECHANICALS) ×1 IMPLANT
SCISSORS LAP 5X35 DISP (ENDOMECHANICALS) ×1 IMPLANT
SET TUBE SMOKE EVAC HIGH FLOW (TUBING) ×1 IMPLANT
SLEEVE Z-THREAD 5X100MM (TROCAR) ×2 IMPLANT
SPECIMEN JAR SMALL (MISCELLANEOUS) ×1 IMPLANT
SUT MNCRL AB 4-0 PS2 18 (SUTURE) ×1 IMPLANT
SUT VICRYL 0 UR6 27IN ABS (SUTURE) IMPLANT
TOWEL GREEN STERILE FF (TOWEL DISPOSABLE) ×1 IMPLANT
TRAY LAPAROSCOPIC MC (CUSTOM PROCEDURE TRAY) ×1 IMPLANT
TROCAR Z THREAD OPTICAL 12X100 (TROCAR) ×1 IMPLANT
TROCAR Z-THREAD OPTICAL 5X100M (TROCAR) ×1 IMPLANT
WARMER LAPAROSCOPE (MISCELLANEOUS) ×1 IMPLANT

## 2023-06-28 NOTE — Progress Notes (Signed)
 PROGRESS NOTE  Tracy Mcgee:[id number] DOB: 26-Jan-1966   PCP: Donita Brooks, MD  Patient is from: Home.  DOA: 06/25/2023 LOS: 2  Chief complaints Chief Complaint  Patient presents with   Abdominal Pain     Brief Narrative / Interim history: 58 y.o. female with medical history significant for stage III chronic kidney disease, hypertension and osteoarthritis, who presented to the emergency room with acute onset of upper abdominal pain, nausea, and vomiting with loose stools after eating a particularly greasy meal.  Found to have abnormal LFTs.  RUQ [place] suggested cholecystitis.  However, MRCP showed choledocholithiasis without cholecystitis.  Patient underwent ERCP by Dr. Leone Payor on 06/27/2023, and laparoscopic cholecystectomy by Dr. Alvan Dame on 06/28/2023.  LFT improved.  Subjective: Seen and examined earlier this morning before she went for laparoscopic cholecystectomy.  No major events overnight of this morning.  No complaints.  She denies nausea, vomiting, abdominal pain, chest pain or dyspnea.  LFT improved this morning.  Objective: Vitals:   06/28/23 0741 06/28/23 1216 06/28/23 1238 06/28/23 1352  BP: 138/82 (!) 155/102 (!) 143/99 (!) 156/99  Pulse: 66 89  72  Resp: 16 16  20   Temp: 98 F (36.7 C) 98 F (36.7 C)  98 F (36.7 C)  TempSrc: Oral Oral    SpO2: 94% 96%  98%  Weight:      Height:        Examination:  GENERAL: No apparent distress.  Nontoxic. HEENT: MMM.  Vision and hearing grossly intact.  NECK: Supple.  No apparent JVD.  RESP:  No IWOB.  Fair aeration bilaterally. CVS:  RRR. Heart sounds normal.  ABD/GI/GU: BS+. Abd soft, NTND.  MSK/EXT:  Moves extremities. No apparent deformity. No edema.  SKIN: no apparent skin lesion or wound NEURO: Awake, alert and oriented appropriately.  No apparent focal neuro deficit. PSYCH: Calm. Normal affect.   Consultants:  Gastroenterology General surgery  Procedures: 3/20-ERCP by Dr.  Leone Payor 3/21-laparoscopic cholecystectomy by Dr. Alvan Dame  Microbiology summarized: None  Assessment and plan: Cholelithiasis with choledocholithiasis: Cholecystitis ruled out Elevated liver enzymes/hyperbilirubinemia: Likely due to the above -S/p ERCP and laparoscopic cholecystectomy as above -Continue monitoring LFT-improving -Diet per general surgery after lap chole.  Essential hypertension: BP slightly elevated this afternoon after surgery. -Continue Avapro after lap chole -IV hydralazine as needed -Pain control   Class II obesity Body mass index is 37.93 kg/m. -Encourage lifestyle change to lose weight         DVT prophylaxis:  enoxaparin (LOVENOX) injection 40 mg Start: 06/26/23 1000  Code Status: Full code Family Communication: None at bedside Level of care: Med-Surg Status is: Inpatient Remains inpatient appropriate because: Cholelithiasis/choledocholithiasis   Final disposition: Home   35 minutes with more than 50% spent in reviewing records, counseling patient/family and coordinating care.   Sch Meds:  Scheduled Meds:  [MAR Hold] enoxaparin (LOVENOX) injection  40 mg Subcutaneous Q24H   HYDROmorphone       [MAR Hold] irbesartan  75 mg Oral Daily   [MAR Hold] multivitamin with minerals  1 tablet Oral Daily   Continuous Infusions:  ceFAZolin     lactated ringers     PRN Meds:.[MAR Hold] acetaminophen **OR** [MAR Hold] acetaminophen, ceFAZolin, droperidol, HYDROmorphone, HYDROmorphone (DILAUDID) injection, ketorolac, [MAR Hold] magnesium hydroxide, [MAR Hold]  morphine injection, [MAR Hold] ondansetron **OR** [MAR Hold] ondansetron (ZOFRAN) IV, ondansetron (ZOFRAN) IV, oxyCODONE **OR** oxyCODONE, [MAR Hold] phenol, [MAR Hold] traZODone  Antimicrobials: Anti-infectives (From admission, onward)    Start  Dose/Rate Route Frequency Ordered Stop   06/28/23 1223  ceFAZolin (ANCEF) 2-4 GM/100ML-% IVPB       Note to Pharmacy: [facility]: cabinet  override      06/28/23 1223 06/29/23 0029   06/27/23 1200  Ampicillin-Sulbactam (UNASYN) 3 g in sodium chloride 0.9 % 100 mL IVPB        3 g 200 mL/hr over 30 Minutes Intravenous 60 min pre-op 06/27/23 1137 06/27/23 1435   06/27/23 1110  ampicillin-sulbactam (UNASYN) 1.5 g in sodium chloride 0.9 % 100 mL IVPB  Status:  Discontinued        1.5 g 200 mL/hr over 30 Minutes Intravenous 60 min pre-op 06/27/23 1110 06/27/23 1136        I have personally reviewed the following labs and images: CBC: Recent Labs  Lab 06/25/23 2232 06/26/23 0529 06/27/23 0433 06/28/23 0436  WBC 8.7 7.0 5.7 6.4  NEUTROABS 5.9  --   --   --   HGB 14.5 14.1 13.0 13.1  HCT 42.6 42.3 38.3 37.6  MCV 90.1 91.2 91.0 87.2  PLT 301 301 253 288   BMP &GFR Recent Labs  Lab 06/25/23 2232 06/26/23 0529 06/27/23 0433 06/28/23 0436  NA 136 138 138 139  K 4.1 4.5 4.3 4.4  CL 101 103 106 108  CO2 25 26 27 24   GLUCOSE 141* 108* 90 130*  BUN 16 14 13 13   CREATININE 1.05* 1.00 1.07* 1.07*  CALCIUM 9.8 9.9 8.9 8.8*   Estimated Creatinine Clearance: 70.7 mL/min (A) (by C-G formula based on SCr of 1.07 mg/dL (H)). Liver & Pancreas: Recent Labs  Lab 06/25/23 2232 06/26/23 0529 06/27/23 0433 06/28/23 0436  AST 543* 707* 202* 72*  ALT 313* 553* 341* 227*  ALKPHOS 58 62 59 52  BILITOT 1.5* 2.0* 1.9* 0.9  PROT 8.4* 7.8 6.7 6.9  ALBUMIN 4.1 3.9 3.3* 3.3*   Recent Labs  Lab 06/25/23 2232  LIPASE 30   No results for input(s): "AMMONIA" in the last 168 hours. Diabetic: No results for input(s): "HGBA1C" in the last 72 hours. No results for input(s): "GLUCAP" in the last 168 hours. Cardiac Enzymes: No results for input(s): "CKTOTAL", "CKMB", "CKMBINDEX", "TROPONINI" in the last 168 hours. No results for input(s): "PROBNP" in the last 8760 hours. Coagulation Profile: Recent Labs  Lab 06/25/23 2232  INR 0.9   Thyroid Function Tests: No results for input(s): "TSH", "T4TOTAL", "FREET4", "T3FREE",  "THYROIDAB" in the last 72 hours. Lipid Profile: No results for input(s): "CHOL", "HDL", "LDLCALC", "TRIG", "CHOLHDL", "LDLDIRECT" in the last 72 hours. Anemia Panel: No results for input(s): "VITAMINB12", "FOLATE", "FERRITIN", "TIBC", "IRON", "RETICCTPCT" in the last 72 hours. Urine analysis: No results found for: "COLORURINE", "APPEARANCEUR", "LABSPEC", "PHURINE", "GLUCOSEU", "HGBUR", "BILIRUBINUR", "KETONESUR", "PROTEINUR", "UROBILINOGEN", "NITRITE", "LEUKOCYTESUR" Sepsis Labs: Invalid input(s): "PROCALCITONIN", "LACTICIDVEN"  Microbiology: No results found for this or any previous visit (from the past 240 hours).  Radiology Studies: No results found.    Symir Mah T. [facility]  If 7PM-7AM, please contact night-coverage [email or web address] 06/28/2023, 2:30 PM

## 2023-06-28 NOTE — Anesthesia Postprocedure Evaluation (Signed)
 Anesthesia Post Note  Patient: Tracy Mcgee  Procedure(s) Performed: LAPAROSCOPIC CHOLECYSTECTOMY w/ICG (Abdomen)     Patient location during evaluation: PACU Anesthesia Type: General Level of consciousness: awake and alert Pain management: pain level controlled Vital Signs Assessment: post-procedure vital signs reviewed and stable Respiratory status: spontaneous breathing, nonlabored ventilation, respiratory function stable and patient connected to nasal cannula oxygen Cardiovascular status: blood pressure returned to baseline and stable Postop Assessment: no apparent nausea or vomiting Anesthetic complications: no  No notable events documented.  Last Vitals:  Vitals:   06/28/23 1445 06/28/23 1546  BP:  116/66  Pulse:  78  Resp:  16  Temp: 36.7 C 37.4 C  SpO2:  99%    Last Pain:  Vitals:   06/28/23 1546  TempSrc: Oral  PainSc:                  Trevor Iha

## 2023-06-28 NOTE — Transfer of Care (Signed)
 Immediate Anesthesia Transfer of Care Note  Patient: Tracy Mcgee  Procedure(s) Performed: LAPAROSCOPIC CHOLECYSTECTOMY w/ICG (Abdomen)  Patient Location: PACU  Anesthesia Type:General  Level of Consciousness: awake, alert , oriented, and patient cooperative  Airway & Oxygen Therapy: Patient Spontanous Breathing and Patient connected to face mask oxygen  Post-op Assessment: Report given to RN and Post -op Vital signs reviewed and stable  Post vital signs: Reviewed and stable  Last Vitals:  Vitals Value Taken Time  BP 156/99 06/28/23 1351  Temp    Pulse 77 06/28/23 1355  Resp 15 06/28/23 1355  SpO2 97 % 06/28/23 1355  Vitals shown include unfiled device data.  Last Pain:  Vitals:   06/28/23 1216  TempSrc: Oral  PainSc: 0-No pain         Complications: No notable events documented.

## 2023-06-28 NOTE — Anesthesia Procedure Notes (Signed)
 Procedure Name: Intubation Date/Time: 06/28/2023 12:56 PM  Performed by: Wilder Glade, CRNAPre-anesthesia Checklist: Patient identified, Emergency Drugs available, Suction available, Patient being monitored and Timeout performed Patient Re-evaluated:Patient Re-evaluated prior to induction Oxygen Delivery Method: Circle system utilized Preoxygenation: Pre-oxygenation with 100% oxygen Induction Type: IV induction Ventilation: Mask ventilation without difficulty Laryngoscope Size: Miller and 2 Grade View: Grade I Tube type: Oral Tube size: 7.0 mm Number of attempts: 1 Airway Equipment and Method: Stylet Placement Confirmation: ETT inserted through vocal cords under direct vision, positive ETCO2 and breath sounds checked- equal and bilateral Secured at: 22 cm Tube secured with: Tape Dental Injury: Teeth and Oropharynx as per pre-operative assessment  Comments: Smooth brief atraumatic dentition unchanged

## 2023-06-28 NOTE — Progress Notes (Signed)
 [facility] night cross cover note:   I was notified by RN that the patient is complaining of a sore throat following yesterday's ERCP.  She remains n.p.o. I subsequently ordered prn Chloraseptic throat spray for her.      Newton Pigg, DO Hospitalist

## 2023-06-28 NOTE — Op Note (Signed)
 PATIENT:  Tracy Mcgee  59 y.o. female  PRE-OPERATIVE DIAGNOSIS:  CHOLECYSTITIS  POST-OPERATIVE DIAGNOSIS:  CHOLECYSTITIS  PROCEDURE:  Procedure(s): LAPAROSCOPIC CHOLECYSTECTOMY w/ICG   SURGEON:  Camey Edell, De Blanch, MD   ASSISTANT: none  ANESTHESIA:   local and general  Indications for procedure: [name] is a 58 y.o. female with symptoms of Abdominal pain and Nausea and vomiting consistent with gallbladder disease, Confirmed by MRI.  Description of procedure: The patient was brought into the operative suite, placed supine. Anesthesia was administered with endotracheal tube. Patient was strapped in place and foot board was secured. All pressure points were offloaded by foam padding. The patient was prepped and draped in the usual sterile fashion.  A periumbilical incision was made and optical entry was used to enter the abdomen. 2 5 mm trocars were placed on in the right lateral space on in the right subcostal space. A 12mm trocar was placed in the subxiphoid space. Marcaine was infused to the subxiphoid space and lateral upper right abdomen in the transversus abdominis plane. Next the patient was placed in reverse trendelenberg. The gallbladder appearedfull of stones and dilated.   The gallbladder was retracted cephalad and lateral. The peritoneum was reflected off the infundibulum working lateral to medial. The cystic duct and cystic artery were identified and further dissection revealed a critical view. The cystic duct and cystic artery were doubly clipped and ligated.   The gallbladder was removed off the liver bed with cautery. The Gallbladder was placed in a specimen bag. The gallbladder fossa was irrigated and hemostasis was applied with cautery. The gallbladder was removed via the 12mm trocar. The fascial defect was closed with interrupted 0 vicryl suture via laparoscopic trans-fascial suture passer and loose stones were removed. Pneumoperitoneum was removed, all trocar  were removed. All incisions were closed with 4-0 monocryl subcuticular stitch. The patient woke from anesthesia and was brought to PACU in stable condition. All counts were correct  Findings: chronic inflammatory changes of the gallbladder  Specimen: gallbladder  Blood loss: 20 ml  Local anesthesia: 30 ml Marcaine  Complications: none  PLAN OF CARE: Admit to inpatient   PATIENT DISPOSITION:  PACU - hemodynamically stable.  De Blanch [facility], [place]

## 2023-06-28 NOTE — Anesthesia Preprocedure Evaluation (Addendum)
 Anesthesia Evaluation  Patient identified by MRN, date of birth, ID band Patient awake    Reviewed: Allergy & Precautions, NPO status , Patient's Chart, lab work & pertinent test results  Airway Mallampati: II  TM Distance: >3 FB Neck ROM: Full    Dental  (+) Teeth Intact, Dental Advisory Given   Pulmonary neg pulmonary ROS   Pulmonary exam normal breath sounds clear to auscultation       Cardiovascular hypertension, Pt. on medications Normal cardiovascular exam Rhythm:Regular Rate:Normal  TTE 2013 - Left ventricle: The cavity size was normal. Wall thickness was    normal. Systolic function was normal. The estimated ejection    fraction was in the range of 60% to 65%. Wall motion was normal;    there were no regional wall motion abnormalities.     Neuro/Psych negative neurological ROS  negative psych ROS   GI/Hepatic negative GI ROS, Neg liver ROS,,,  Endo/Other  negative endocrine ROS    Renal/GU Renal InsufficiencyRenal disease  negative genitourinary   Musculoskeletal negative musculoskeletal ROS (+)    Abdominal   Peds  Hematology negative hematology ROS (+)   Anesthesia Other Findings   Reproductive/Obstetrics                             Anesthesia Physical Anesthesia Plan  ASA: 2  Anesthesia Plan: General   Post-op Pain Management: Toradol IV (intra-op)* and Tylenol PO (pre-op)*   Induction: Intravenous  PONV Risk Score and Plan: 3 and Midazolam, Dexamethasone and Ondansetron  Airway Management Planned: Oral ETT  Additional Equipment: None  Intra-op Plan:   Post-operative Plan: Extubation in OR  Informed Consent: I have reviewed the patients History and Physical, chart, labs and discussed the procedure including the risks, benefits and alternatives for the proposed anesthesia with the patient or authorized representative who has indicated his/her understanding and  acceptance.     Dental advisory given  Plan Discussed with: CRNA and Surgeon  Anesthesia Plan Comments:        Anesthesia Quick Evaluation

## 2023-06-28 NOTE — Plan of Care (Signed)

## 2023-06-28 NOTE — TOC Progression Note (Signed)
 Transition of Care [facility]) - Progression Note    Patient Details  Name: Tracy Mcgee MRN: [id number] Date of Birth: Jul 12, 1965  [facility] CM/SW Contact  Tom-Johnson, Hershal Coria, RN Phone Number: 06/28/2023, 1:28 PM  Clinical Narrative:     Patient admitted with persistent Abdominal pain. MRCP 06/26/23 showed Choledocholithiasis, ERCP done yesterday 06/27/23 and scheduled for Lap Choley today 06/28/23. GI and Gen sx following.    Patient not Medically ready for discharge.  CM will continue to follow as patient progresses with care towards discharge.       Expected Discharge Plan and Services                                               Social Determinants of Health (SDOH) Interventions SDOH Screenings   Food Insecurity: No Food Insecurity (06/26/2023)  Housing: Low Risk  (06/26/2023)  Transportation Needs: No Transportation Needs (06/26/2023)  Utilities: Not At Risk (06/26/2023)  Alcohol Screen: Low Risk  (04/03/2023)  Depression (PHQ2-9): Low Risk  (04/04/2023)  Financial Resource Strain: Low Risk  (04/03/2023)  Physical Activity: Insufficiently Active (04/03/2023)  Social Connections: Socially Integrated (04/03/2023)  Stress: No Stress Concern Present (04/03/2023)  Tobacco Use: Low Risk  (06/28/2023)    Readmission Risk Interventions    06/26/2023    8:11 AM  Readmission Risk Prevention Plan  Medication Screening Complete  Transportation Screening Complete

## 2023-06-28 NOTE — Progress Notes (Signed)
 Pre Procedure note for inpatients:   Tracy Mcgee has been scheduled for Procedure(s) with comments: LAPAROSCOPIC CHOLECYSTECTOMY (N/A) - WITH ICG DYE today. The various methods of treatment have been discussed with the patient. After consideration of the risks, benefits and treatment options the patient has consented to the planned procedure.   The patient has been seen and labs reviewed. There are no changes in the patient's condition to prevent proceeding with the planned procedure today.  Recent labs:  Lab Results  Component Value Date   WBC 6.4 06/28/2023   HGB 13.1 06/28/2023   HCT 37.6 06/28/2023   PLT 288 06/28/2023   GLUCOSE 130 (H) 06/28/2023   CHOL 195 08/28/2021   TRIG 159 (H) 08/28/2021   HDL 54 08/28/2021   LDLCALC 114 (H) 08/28/2021   ALT 227 (H) 06/28/2023   AST 72 (H) 06/28/2023   NA 139 06/28/2023   K 4.4 06/28/2023   CL 108 06/28/2023   CREATININE 1.07 (H) 06/28/2023   BUN 13 06/28/2023   CO2 24 06/28/2023   TSH 2.900 12/25/2013   INR 0.9 06/25/2023   HGBA1C 5.4 08/28/2021    Rodman Pickle, MD 06/28/2023 12:16 PM

## 2023-06-29 ENCOUNTER — Encounter (HOSPITAL_COMMUNITY): Payer: Self-pay | Admitting: General Surgery

## 2023-06-29 DIAGNOSIS — K8050 Calculus of bile duct without cholangitis or cholecystitis without obstruction: Secondary | ICD-10-CM | POA: Diagnosis not present

## 2023-06-29 DIAGNOSIS — R7989 Other specified abnormal findings of blood chemistry: Secondary | ICD-10-CM | POA: Diagnosis not present

## 2023-06-29 DIAGNOSIS — I10 Essential (primary) hypertension: Secondary | ICD-10-CM | POA: Diagnosis not present

## 2023-06-29 DIAGNOSIS — R1011 Right upper quadrant pain: Secondary | ICD-10-CM | POA: Diagnosis not present

## 2023-06-29 LAB — COMPREHENSIVE METABOLIC PANEL
ALT: 161 U/L — ABNORMAL HIGH (ref 0–44)
AST: 76 U/L — ABNORMAL HIGH (ref 15–41)
Albumin: 3.2 g/dL — ABNORMAL LOW (ref 3.5–5.0)
Alkaline Phosphatase: 46 U/L (ref 38–126)
Anion gap: 9 (ref 5–15)
BUN: 11 mg/dL (ref 6–20)
CO2: 24 mmol/L (ref 22–32)
Calcium: 8.7 mg/dL — ABNORMAL LOW (ref 8.9–10.3)
Chloride: 107 mmol/L (ref 98–111)
Creatinine, Ser: 1.08 mg/dL — ABNORMAL HIGH (ref 0.44–1.00)
GFR, Estimated: 60 mL/min — ABNORMAL LOW (ref >60)
Glucose, Bld: 93 mg/dL (ref 70–99)
Potassium: 3.8 mmol/L (ref 3.5–5.1)
Sodium: 140 mmol/L (ref 135–145)
Total Bilirubin: 0.8 mg/dL (ref 0.0–1.2)
Total Protein: 6.3 g/dL — ABNORMAL LOW (ref 6.5–8.1)

## 2023-06-29 LAB — CBC
HCT: 36.3 % (ref 36.0–46.0)
Hemoglobin: 12.1 g/dL (ref 12.0–15.0)
MCH: 30 pg (ref 26.0–34.0)
MCHC: 33.3 g/dL (ref 30.0–36.0)
MCV: 90.1 fL (ref 80.0–100.0)
Platelets: 270 10*3/uL (ref 150–400)
RBC: 4.03 MIL/uL (ref 3.87–5.11)
RDW: 13.3 % (ref 11.5–15.5)
WBC: 10.1 10*3/uL (ref 4.0–10.5)
nRBC: 0 % (ref 0.0–0.2)

## 2023-06-29 LAB — MAGNESIUM: Magnesium: 1.9 mg/dL (ref 1.7–2.4)

## 2023-06-29 LAB — LIPASE, BLOOD: Lipase: 22 U/L (ref 11–51)

## 2023-06-29 MED ORDER — ACETAMINOPHEN 325 MG PO TABS: 650.0000 mg | ORAL_TABLET | Freq: Four times a day (QID) | ORAL | Status: AC | PRN

## 2023-06-29 NOTE — Progress Notes (Signed)
 Daily Progress Note  DOA: 06/25/2023 Hospital Day: 5   Chief Complaint: CBD stones  ASSESSMENT    58 y.o. year old female with elevated LFTs, cholelithiasis / choledocholithiasis s/p ERCP with sphincterotomy and stone extraction Tbili normalized, liver enzymes improving   Diverticulosis CKD 3 Hypertension Renal stones See PMH for additional history   PLAN   --Sounds like she may be going home today --Discussed need for outpatient screening colonoscopy when recovered from gallbladder surgery  Interim History / Subjective   --No abdominal pain. Had cholecystectomy yesterday. Tolerating full liquid   Objective   Recent Labs    06/27/23 0433 06/28/23 0436 06/29/23 0413  WBC 5.7 6.4 10.1  HGB 13.0 13.1 12.1  HCT 38.3 37.6 36.3  MCV 91.0 87.2 90.1  PLT 253 288 270   No results for input(s): "FOLATE", "VITAMINB12", "FERRITIN", "TIBC", "IRONPCTSAT" in the last 72 hours. Recent Labs    06/27/23 0433 06/28/23 0436 06/29/23 0413  NA 138 139 140  K 4.3 4.4 3.8  CL 106 108 107  CO2 27 24 24   GLUCOSE 90 130* 93  BUN 13 13 11   CREATININE 1.07* 1.07* 1.08*  CALCIUM 8.9 8.8* 8.7*   Recent Labs    06/27/23 0433 06/28/23 0436 06/29/23 0413  PROT 6.7 6.9 6.3*  ALBUMIN 3.3* 3.3* 3.2*  AST 202* 72* 76*  ALT 341* 227* 161*  ALKPHOS 59 52 46  BILITOT 1.9* 0.9 0.8   No results for input(s): "INR" in the last 72 hours. No results for input(s): "AFPTUMOR" in the last 72 hours.   No results for input(s): "ANA" in the last 72 hours.  Imaging:  DG ERCP CLINICAL DATA:  Choledocholithiasis  EXAM: ERCP  TECHNIQUE: Multiple spot images obtained with the fluoroscopic device and submitted for interpretation post-procedure.  FLUOROSCOPY: Radiation Exposure Index (as provided by the fluoroscopic device): 43.91 mGy Kerma  COMPARISON:  MRCP 06/26/2023  FINDINGS: A total of 9 intraoperative saved images are submitted for review. The images demonstrate a  flexible duodenal scope in the descending duodenum followed by wire cannulation of the common bile duct. Cholangiography demonstrates no biliary ductal dilatation. However, filling defects are present within the common bile duct consistent with known choledocholithiasis. Subsequent images demonstrate sphincterotomy and balloon sweeping of the common duct.  IMPRESSION: 1. Choledocholithiasis without significant biliary dilatation. 2. ERCP with sphincterotomy and balloon sweeping of the common duct.  These images were submitted for radiologic interpretation only. Please see the procedural report for the amount of contrast and the fluoroscopy time utilized.  Electronically Signed   By: Malachy Moan M.D.   On: 06/29/2023 07:43     Scheduled inpatient medications:   acetaminophen  1,000 mg Oral Q6H   enoxaparin (LOVENOX) injection  40 mg Subcutaneous Q24H   irbesartan  75 mg Oral Daily   multivitamin with minerals  1 tablet Oral Daily   Continuous inpatient infusions:  PRN inpatient medications: hydrALAZINE, magnesium hydroxide, morphine injection, ondansetron **OR** ondansetron (ZOFRAN) IV, oxyCODONE, phenol, traZODone  Vital signs in last 24 hours: Temp:  [98 F (36.7 C)-99.3 F (37.4 C)] 98 F (36.7 C) (03/22 0745) Pulse Rate:  [68-89] 72 (03/22 0745) Resp:  [16-20] 16 (03/22 0745) BP: (108-156)/(66-102) 124/82 (03/22 0745) SpO2:  [95 %-99 %] 97 % (03/22 0745) Last BM Date : 06/27/23  Intake/Output Summary (Last 24 hours) at 06/29/2023 1005 Last data filed at 06/28/2023 1347 Gross per 24 hour  Intake 950 ml  Output 10 ml  Net 940 ml    Intake/Output from previous day: 03/21 0701 - 03/22 0700 In: 950 [I.V.:900; IV Piggyback:50] Out: 10 [Blood:10] Intake/Output this shift: No intake/output data recorded.   Physical Exam:  General: Alert female in NAD Heart:  Regular rate and rhythm.  Pulmonary: Normal respiratory effort Abdomen: Soft, nondistended,  nontender. Normal bowel sounds. Neurologic: Alert and oriented Psych: Pleasant. Cooperative     LOS: 3 days   Willette Cluster ,NP 06/29/2023, 10:05 AM

## 2023-06-29 NOTE — Discharge Instructions (Signed)
LAPAROSCOPIC SURGERY: POST OP INSTRUCTIONS  DIET: Follow a light bland diet the first 24 hours after arrival home, such as soup, liquids, crackers, etc.  Be sure to include lots of fluids daily.  Avoid fast food or heavy meals as your are more likely to get nauseated.  Eat a low fat the next few days after surgery.   Take your usually prescribed home medications unless otherwise directed. PAIN CONTROL: Pain is best controlled by a usual combination of three different methods TOGETHER: Ice/Heat Over the counter pain medication Prescription pain medication Most patients will experience some swelling and bruising around the incisions.  Ice packs or heating pads (30-60 minutes up to 6 times a day) will help. Use ice for the first few days to help decrease swelling and bruising, then switch to heat to help relax tight/sore spots and speed recovery.  Some people prefer to use ice alone, heat alone, alternating between ice & heat.  Experiment to what works for you.  Swelling and bruising can take several weeks to resolve.   It is helpful to take an over-the-counter pain medication regularly for the first few weeks.  Choose one of the following that works best for you: Naproxen (Aleve, etc)  Two 220mg tabs twice a day Ibuprofen (Advil, etc) Three 200mg tabs four times a day (every meal & bedtime) A  prescription for pain medication (such as percocet, vicodin, oxycodone, hydrocodone, etc) should be given to you upon discharge.  Take your pain medication as prescribed.  If you are having problems/concerns with the prescription medicine (does not control pain, nausea, vomiting, rash, itching, etc), please call us [phone] to see if we need to switch you to a different pain medicine that will work better for you and/or control your side effect better. If you need a refill on your pain medication, please contact your pharmacy.  They will contact our office to request authorization. Prescriptions will not be  filled after 5 pm or on week-ends.   Avoid getting constipated.  Between the surgery and the pain medications, it is common to experience some constipation.  Increasing fluid intake and taking a fiber supplement (such as Metamucil, Citrucel, FiberCon, MiraLax, etc) 1-2 times a day regularly will usually help prevent this problem from occurring.  A mild laxative (prune juice, Milk of Magnesia, MiraLax, etc) should be taken according to package directions if there are no bowel movements after 48 hours.   Watch out for diarrhea.  If you have many loose bowel movements, simplify your diet to bland foods & liquids for a few days.  Stop any stool softeners and decrease your fiber supplement.  Switching to mild anti-diarrheal medications (Kayopectate, Pepto Bismol) can help.  If this worsens or does not improve, please call us. Wash / shower every day.  You may shower over the dressings as they are waterproof.  Continue to shower over incision(s) after the dressing is off. Remove your waterproof bandages 5 days after surgery.  You may leave the incision open to air.  You may replace a dressing/Band-Aid to cover the incision for comfort if you wish.  ACTIVITIES as tolerated:   You may resume regular (light) daily activities beginning the next day--such as daily self-care, walking, climbing stairs--gradually increasing activities as tolerated.  If you can walk 30 minutes without difficulty, it is safe to try more intense activity such as jogging, treadmill, bicycling, low-impact aerobics, swimming, etc. Save the most intensive and strenuous activity for last such as sit-ups, heavy   lifting, contact sports, etc  Refrain from any heavy lifting or straining until you are off narcotics for pain control.   DO NOT PUSH THROUGH PAIN.  Let pain be your guide: If it hurts to do something, don't do it.  Pain is your body warning you to avoid that activity for another week until the pain goes down. You may drive when you are  no longer taking prescription pain medication, you can comfortably wear a seatbelt, and you can safely maneuver your car and apply brakes. You may have sexual intercourse when it is comfortable.  FOLLOW UP in our office Please call [facility] at [phone] to set up an appointment to see your surgeon in the office for a follow-up appointment approximately 2-3 weeks after your surgery. Make sure that you call for this appointment the day you arrive home to insure a convenient appointment time. 10. IF YOU HAVE DISABILITY OR FAMILY LEAVE FORMS, BRING THEM TO THE OFFICE FOR PROCESSING.  DO NOT GIVE THEM TO YOUR DOCTOR.   WHEN TO CALL US [phone]: Poor pain control Reactions / problems with new medications (rash/itching, nausea, etc)  Fever over 101.5 F (38.5 C) Inability to urinate Nausea and/or vomiting Worsening swelling or bruising Continued bleeding from incision. Increased pain, redness, or drainage from the incision   The clinic staff is available to answer your questions during regular business hours (8:30am-5pm).  Please don't hesitate to call and ask to speak to one of our nurses for clinical concerns.   If you have a medical emergency, go to the nearest emergency room or call 911.  A surgeon from [facility] is always on call at the hospitals   [facility] [address], [address], [place], [place]  [address] ? MAIN: [phone] ? TOLL FREE: [phone] ?  FAX [phone] [email or web address]   

## 2023-06-29 NOTE — Plan of Care (Signed)

## 2023-06-29 NOTE — Progress Notes (Signed)
 1 Day Post-Op Lap chole Subjective: Doing well, min pain, tolerating a diet  Objective: Vital signs in last 24 hours: Temp:  [98 F (36.7 C)-99.3 F (37.4 C)] 98 F (36.7 C) (03/22 0745) Pulse Rate:  [68-89] 72 (03/22 0745) Resp:  [16-20] 16 (03/22 0745) BP: (108-156)/(66-102) 124/82 (03/22 0745) SpO2:  [95 %-99 %] 97 % (03/22 0745)   Intake/Output from previous day: 03/21 0701 - 03/22 0700 In: 950 [I.V.:900; IV Piggyback:50] Out: 10 [Blood:10] Intake/Output this shift: No intake/output data recorded.   General appearance: alert and cooperative GI: soft  Incision: no significant drainage  Lab Results:  Recent Labs    06/28/23 0436 06/29/23 0413  WBC 6.4 10.1  HGB 13.1 12.1  HCT 37.6 36.3  PLT 288 270   BMET Recent Labs    06/28/23 0436 06/29/23 0413  NA 139 140  K 4.4 3.8  CL 108 107  CO2 24 24  GLUCOSE 130* 93  BUN 13 11  CREATININE 1.07* 1.08*  CALCIUM 8.8* 8.7*   PT/INR No results for input(s): "LABPROT", "INR" in the last 72 hours. ABG No results for input(s): "PHART", "HCO3" in the last 72 hours.  Invalid input(s): "PCO2", "PO2"  MEDS, Scheduled  acetaminophen  1,000 mg Oral Q6H   enoxaparin (LOVENOX) injection  40 mg Subcutaneous Q24H   irbesartan  75 mg Oral Daily   multivitamin with minerals  1 tablet Oral Daily    Studies/Results: DG ERCP Result Date: 06/29/2023 CLINICAL DATA:  Choledocholithiasis EXAM: ERCP TECHNIQUE: Multiple spot images obtained with the fluoroscopic device and submitted for interpretation post-procedure. FLUOROSCOPY: Radiation Exposure Index (as provided by the fluoroscopic device): 43.91 mGy Kerma COMPARISON:  MRCP 06/26/2023 FINDINGS: A total of 9 intraoperative saved images are submitted for review. The images demonstrate a flexible duodenal scope in the descending duodenum followed by wire cannulation of the common bile duct. Cholangiography demonstrates no biliary ductal dilatation. However, filling defects are  present within the common bile duct consistent with known choledocholithiasis. Subsequent images demonstrate sphincterotomy and balloon sweeping of the common duct. IMPRESSION: 1. Choledocholithiasis without significant biliary dilatation. 2. ERCP with sphincterotomy and balloon sweeping of the common duct. These images were submitted for radiologic interpretation only. Please see the procedural report for the amount of contrast and the fluoroscopy time utilized. Electronically Signed   By: Malachy Moan M.D.   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

## 2023-06-29 NOTE — Discharge Summary (Signed)
 Physician Discharge Summary  Tracy Mcgee ZOX:[id number] DOB: [birth year] DOA: 06/25/2023  PCP: Donita Brooks, MD  Admit date: 06/25/2023 Discharge date: 06/29/23  Admitted From: Home Disposition: Home Recommendations for Outpatient Follow-up:  Follow up with PCP in 1 to 2 weeks Outpatient follow-up with general surgery as below Check CMP and CBC at follow-up Please follow up on the following pending results: Pathology from cholecystectomy  Home Health: No need identified Equipment/Devices: No need identified  Discharge Condition: Stable CODE STATUS: Full code  Follow-up Information     [facility] Schedule an appointment as soon as possible for a visit in 2 week(s).   Specialty: General Surgery Contact information: [address] [place] [address] [phone]        Donita Brooks, MD. Schedule an appointment as soon as possible for a visit in 1 week(s).   Specialty: Family Medicine Contact information: [address] [place] [address] [phone]                 Hospital course 58 y.o. female with medical history significant for stage III chronic kidney disease, hypertension and osteoarthritis, who presented to the emergency room with acute onset of upper abdominal pain, nausea, and vomiting with loose stools after eating a particularly greasy meal.  Found to have abnormal LFTs.  RUQ [place] suggested cholecystitis.  However, MRCP showed choledocholithiasis without cholecystitis.  Patient underwent ERCP by Dr. Leone Payor on 06/27/2023, and laparoscopic cholecystectomy by Dr. Alvan Dame on 06/28/2023.  LFT improved.   On the day of discharge, tolerated diet without issue.  Felt well and ready to go home.  Cleared for discharge by general surgery.  Outpatient follow-up as above.  See individual problem list below for more.   Problems addressed during this hospitalization Cholelithiasis with  choledocholithiasis: Cholecystitis ruled out Elevated liver enzymes/hyperbilirubinemia: Likely due to the above -S/p ERCP by Dr. Leone Payor on 3/20 and laparoscopic cholecystectomy by Dr. Sheliah Hatch on 3/21 -LFT improved.  Recheck CMP at follow-up -Tolerated diet.  Cleared for discharge.   Essential hypertension: Normotensive. -Continue home meds.  Class II obesity Body mass index is 37.93 kg/m. -Encourage lifestyle change to lose weight           Time spent 35 minutes  Vital signs Vitals:   06/28/23 2030 06/29/23 0030 06/29/23 0526 06/29/23 0745  BP: 109/72 108/73 129/82 124/82  Pulse: 78 73 68 72  Temp: 98.9 F (37.2 C) 98.5 F (36.9 C) 98.6 F (37 C) 98 F (36.7 C)  Resp: 18 19 18 16   Height:      Weight:      SpO2: 96% 97% 95% 97%  TempSrc: Oral Oral Oral Oral  BMI (Calculated):         Discharge exam  GENERAL: No apparent distress.  Nontoxic. HEENT: MMM.  Vision and hearing grossly intact.  NECK: Supple.  No apparent JVD.  RESP:  No IWOB.  Fair aeration bilaterally. CVS:  RRR. Heart sounds normal.  ABD/GI/GU: BS+. Abd soft, NTND.  Laparoscopic wounds clean. MSK/EXT:  Moves extremities. No apparent deformity. No edema.  SKIN: no apparent skin lesion or wound NEURO: Awake and alert. Oriented appropriately.  No apparent focal neuro deficit. PSYCH: Calm. Normal affect.   Discharge Instructions Discharge Instructions     Diet - low sodium heart healthy   Complete by: As directed    Discharge instructions   Complete by: As directed    It has  been a pleasure taking care of you!  You were hospitalized due to elevated liver enzymes and gallstone for which you have been treated with ERCP and surgery (gallbladder removal).  Follow-up with your primary care doctor in 1 to 2 weeks to have your liver enzymes rechecked.  Follow-up with general surgery per their recommendation.   Take care,   Increase activity slowly   Complete by: As directed       Allergies as  of 06/29/2023   No Known Allergies      Medication List     TAKE these medications    acetaminophen 325 MG tablet Commonly known as: Tylenol Take 2 tablets (650 mg total) by mouth every 6 (six) hours as needed for up to 5 days for mild pain (pain score 1-3), headache or fever.   multivitamin with minerals tablet Take 1 tablet by mouth daily.   valsartan 80 MG tablet Commonly known as: DIOVAN Take 1 tablet (80 mg total) by mouth daily.        Consultations: Gastroenterology General Surgery  Procedures/Studies: 3/20-ERCP by Dr. Leone Payor 3/21-laparoscopic cholecystectomy by Dr. Sheliah Hatch   DG ERCP Result Date: 06/29/2023 CLINICAL DATA:  Choledocholithiasis EXAM: ERCP TECHNIQUE: Multiple spot images obtained with the fluoroscopic device and submitted for interpretation post-procedure. FLUOROSCOPY: Radiation Exposure Index (as provided by the fluoroscopic device): 43.91 mGy Kerma COMPARISON:  MRCP 06/26/2023 FINDINGS: A total of 9 intraoperative saved images are submitted for review. The images demonstrate a flexible duodenal scope in the descending duodenum followed by wire cannulation of the common bile duct. Cholangiography demonstrates no biliary ductal dilatation. However, filling defects are present within the common bile duct consistent with known choledocholithiasis. Subsequent images demonstrate sphincterotomy and balloon sweeping of the common duct. IMPRESSION: 1. Choledocholithiasis without significant biliary dilatation. 2. ERCP with sphincterotomy and balloon sweeping of the common duct. These images were submitted for radiologic interpretation only. Please see the procedural report for the amount of contrast and the fluoroscopy time utilized. Electronically Signed   By: Malachy Moan M.D.   On: 06/29/2023 07:43   MR ABDOMEN MRCP W WO CONTAST Result Date: 06/26/2023 CLINICAL DATA:  Elevated liver enzymes, right upper quadrant pain EXAM: MRI ABDOMEN WITHOUT AND WITH  CONTRAST (INCLUDING MRCP) TECHNIQUE: Multiplanar multisequence MR imaging of the abdomen was performed both before and after the administration of intravenous contrast. Heavily T2-weighted images of the biliary and pancreatic ducts were obtained, and three-dimensional MRCP images were rendered by post processing. CONTRAST:  10mL GADAVIST GADOBUTROL 1 MMOL/ML IV SOLN COMPARISON:  CT abdomen pelvis, 06/25/2023 FINDINGS: Lower chest: No acute abnormality. Hepatobiliary: No solid liver abnormality is seen. Gallbladder is distended by innumerable tiny calculi (series 4, image 22). No gallbladder wall thickening. At least one small gallstone in the central common bile duct near the ampulla measuring 0.4 cm (series 4, image 23), possibly additional tiny calculi or gravel. Common bile duct is nondistended. There may be minimal intrahepatic biliary ductal dilatation. Pancreas: Unremarkable. No pancreatic ductal dilatation or surrounding inflammatory changes. Spleen: Normal in size without significant abnormality. Adrenals/Urinary Tract: Adrenal glands are unremarkable. Kidneys are normal, without renal calculi, solid lesion, or hydronephrosis. Stomach/Bowel: Stomach is within normal limits. No evidence of bowel wall thickening, distention, or inflammatory changes. Vascular/Lymphatic: No significant vascular findings are present. No enlarged abdominal lymph nodes. Other: No abdominal wall hernia or abnormality. No ascites. Musculoskeletal: No acute or significant osseous findings. IMPRESSION: 1. Cholelithiasis without evidence of acute cholecystitis; gallbladder is distended by innumerable tiny calculi  without gallbladder wall thickening or pericholecystic fluid. 2. Choledocholithiasis; at least one small gallstone in the central common bile duct near the ampulla measuring 0.4 cm, possibly additional tiny calculi or gravel. Common bile duct is nondistended. There may be minimal intrahepatic biliary ductal dilatation.  Electronically Signed   By: Jearld Lesch M.D.   On: 06/26/2023 14:15   MR 3D Recon At Scanner Result Date: 06/26/2023 CLINICAL DATA:  Elevated liver enzymes, right upper quadrant pain EXAM: MRI ABDOMEN WITHOUT AND WITH CONTRAST (INCLUDING MRCP) TECHNIQUE: Multiplanar multisequence MR imaging of the abdomen was performed both before and after the administration of intravenous contrast. Heavily T2-weighted images of the biliary and pancreatic ducts were obtained, and three-dimensional MRCP images were rendered by post processing. CONTRAST:  10mL GADAVIST GADOBUTROL 1 MMOL/ML IV SOLN COMPARISON:  CT abdomen pelvis, 06/25/2023 FINDINGS: Lower chest: No acute abnormality. Hepatobiliary: No solid liver abnormality is seen. Gallbladder is distended by innumerable tiny calculi (series 4, image 22). No gallbladder wall thickening. At least one small gallstone in the central common bile duct near the ampulla measuring 0.4 cm (series 4, image 23), possibly additional tiny calculi or gravel. Common bile duct is nondistended. There may be minimal intrahepatic biliary ductal dilatation. Pancreas: Unremarkable. No pancreatic ductal dilatation or surrounding inflammatory changes. Spleen: Normal in size without significant abnormality. Adrenals/Urinary Tract: Adrenal glands are unremarkable. Kidneys are normal, without renal calculi, solid lesion, or hydronephrosis. Stomach/Bowel: Stomach is within normal limits. No evidence of bowel wall thickening, distention, or inflammatory changes. Vascular/Lymphatic: No significant vascular findings are present. No enlarged abdominal lymph nodes. Other: No abdominal wall hernia or abnormality. No ascites. Musculoskeletal: No acute or significant osseous findings. IMPRESSION: 1. Cholelithiasis without evidence of acute cholecystitis; gallbladder is distended by innumerable tiny calculi without gallbladder wall thickening or pericholecystic fluid. 2. Choledocholithiasis; at least one small  gallstone in the central common bile duct near the ampulla measuring 0.4 cm, possibly additional tiny calculi or gravel. Common bile duct is nondistended. There may be minimal intrahepatic biliary ductal dilatation. Electronically Signed   By: Jearld Lesch M.D.   On: 06/26/2023 14:15   US Abdomen Limited RUQ (LIVER/GB) Result Date: 06/26/2023 CLINICAL DATA:  Right upper quadrant abdominal pain EXAM: ULTRASOUND ABDOMEN LIMITED RIGHT UPPER QUADRANT COMPARISON:  CT 06/25/2023 FINDINGS: Gallbladder: Dilated gallbladder. Slight gallbladder wall thickening of 5 mm. Echogenic appearing wall with shadowed areas of the gallbladder. This could be a gallbladder full of stones but not classic in appearance. Common bile duct: Diameter: 4 mm Liver: No focal lesion identified. Within normal limits in parenchymal echogenicity. Portal vein is patent on color Doppler imaging with normal direction of blood flow towards the liver. Other: Evaluation limited with overlapping bowel gas and soft tissue. IMPRESSION: Dilated gallbladder with question of a gallbladder full of stones but there overall limitations and the appearance is not classic. Recommend further workup with MRCP to further delineate. No ductal dilatation. Electronically Signed   By: Karen Kays M.D.   On: 06/26/2023 10:21   CT ABDOMEN PELVIS W CONTRAST Result Date: 06/26/2023 CLINICAL DATA:  Intermittent abdominal pain and nausea. EXAM: CT ABDOMEN AND PELVIS WITH CONTRAST TECHNIQUE: Multidetector CT imaging of the abdomen and pelvis was performed using the standard protocol following bolus administration of intravenous contrast. RADIATION DOSE REDUCTION: This exam was performed according to the departmental dose-optimization program which includes automated exposure control, adjustment of the mA and/or kV according to patient size and/or use of iterative reconstruction technique. CONTRAST:  OMNIPAQUE IOHEXOL  300 MG/ML  SOLN COMPARISON:  None Available.  FINDINGS: Lower chest: No acute abnormality. Hepatobiliary: No focal liver abnormality is seen. A moderate amount of sludge is suspected within a distended gallbladder, without evidence of gallstones, gallbladder wall thickening, or biliary dilatation. Pancreas: Unremarkable. No pancreatic ductal dilatation or surrounding inflammatory changes. Spleen: Normal in size without focal abnormality. Adrenals/Urinary Tract: Adrenal glands are unremarkable. Kidneys are normal, without obstructing renal calculi, focal lesion, or hydronephrosis. A 3 mm nonobstructing renal calculus is seen within the upper pole of the right kidney. Bladder is unremarkable. Stomach/Bowel: Stomach is within normal limits. Appendix appears normal. No evidence of bowel wall thickening, distention, or inflammatory changes. Noninflamed diverticula are seen throughout the sigmoid colon Vascular/Lymphatic: No significant vascular findings are present. No enlarged abdominal or pelvic lymph nodes. Reproductive: Uterus and bilateral adnexa are unremarkable. Other: No abdominal wall hernia or abnormality. No abdominopelvic ascites. Musculoskeletal: No acute or significant osseous findings. IMPRESSION: 1. Moderate amount of sludge within a distended gallbladder. Correlation with nonemergent right upper quadrant ultrasound is recommended. 2. 3 mm nonobstructing right renal calculus. 3. Sigmoid diverticulosis. Electronically Signed   By: Aram Candela M.D.   On: 06/26/2023 00:24       The results of significant diagnostics from this hospitalization (including imaging, microbiology, ancillary and laboratory) are listed below for reference.     Microbiology: No results found for this or any previous visit (from the past 240 hours).   Labs:  CBC: Recent Labs  Lab 06/25/23 2232 06/26/23 0529 06/27/23 0433 06/28/23 0436 06/29/23 0413  WBC 8.7 7.0 5.7 6.4 10.1  NEUTROABS 5.9  --   --   --   --   HGB 14.5 14.1 13.0 13.1 12.1  HCT 42.6  42.3 38.3 37.6 36.3  MCV 90.1 91.2 91.0 87.2 90.1  PLT 301 301 253 288 270   BMP &GFR Recent Labs  Lab 06/25/23 2232 06/26/23 0529 06/27/23 0433 06/28/23 0436 06/29/23 0413  NA 136 138 138 139 140  K 4.1 4.5 4.3 4.4 3.8  CL 101 103 106 108 107  CO2 25 26 27 24 24   GLUCOSE 141* 108* 90 130* 93  BUN 16 14 13 13 11   CREATININE 1.05* 1.00 1.07* 1.07* 1.08*  CALCIUM 9.8 9.9 8.9 8.8* 8.7*  MG  --   --   --   --  1.9   Estimated Creatinine Clearance: 70.1 mL/min (A) (by C-G formula based on SCr of 1.08 mg/dL (H)). Liver & Pancreas: Recent Labs  Lab 06/25/23 2232 06/26/23 0529 06/27/23 0433 06/28/23 0436 06/29/23 0413  AST 543* 707* 202* 72* 76*  ALT 313* 553* 341* 227* 161*  ALKPHOS 58 62 59 52 46  BILITOT 1.5* 2.0* 1.9* 0.9 0.8  PROT 8.4* 7.8 6.7 6.9 6.3*  ALBUMIN 4.1 3.9 3.3* 3.3* 3.2*   Recent Labs  Lab 06/25/23 2232 06/29/23 0413  LIPASE 30 22   No results for input(s): "AMMONIA" in the last 168 hours. Diabetic: No results for input(s): "HGBA1C" in the last 72 hours. No results for input(s): "GLUCAP" in the last 168 hours. Cardiac Enzymes: No results for input(s): "CKTOTAL", "CKMB", "CKMBINDEX", "TROPONINI" in the last 168 hours. No results for input(s): "PROBNP" in the last 8760 hours. Coagulation Profile: Recent Labs  Lab 06/25/23 2232  INR 0.9   Thyroid Function Tests: No results for input(s): "TSH", "T4TOTAL", "FREET4", "T3FREE", "THYROIDAB" in the last 72 hours. Lipid Profile: No results for input(s): "CHOL", "HDL", "LDLCALC", "TRIG", "CHOLHDL", "LDLDIRECT" in the  last 72 hours. Anemia Panel: No results for input(s): "VITAMINB12", "FOLATE", "FERRITIN", "TIBC", "IRON", "RETICCTPCT" in the last 72 hours. Urine analysis: No results found for: "COLORURINE", "APPEARANCEUR", "LABSPEC", "PHURINE", "GLUCOSEU", "HGBUR", "BILIRUBINUR", "KETONESUR", "PROTEINUR", "UROBILINOGEN", "NITRITE", "LEUKOCYTESUR" Sepsis Labs: Invalid input(s): "PROCALCITONIN",  "LACTICIDVEN"   SIGNED:  Almon Hercules, MD  [facility] 06/29/2023, 4:30 PM

## 2023-07-01 ENCOUNTER — Telehealth: Payer: Self-pay

## 2023-07-01 LAB — SURGICAL PATHOLOGY

## 2023-07-01 NOTE — Transitions of Care (Post Inpatient/ED Visit) (Signed)
   07/01/2023  Name: Tracy Mcgee MRN: [id number] DOB: 19-May-1965  Today's TOC FU Call Status: Today's TOC FU Call Status:: Successful TOC FU Call Completed TOC FU Call Complete Date: 07/01/23 Patient's Name and Date of Birth confirmed.  Transition Care Management Follow-up Telephone Call Date of Discharge: 06/29/23 Discharge Facility: [facility]) Type of Discharge: Inpatient Admission Primary Inpatient Discharge Diagnosis:: epigastric pain How have you been since you were released from the hospital?: Better Any questions or concerns?: No  Items Reviewed: Did you receive and understand the discharge instructions provided?: Yes Medications obtained,verified, and reconciled?: Yes (Medications Reviewed) Any new allergies since your discharge?: No Dietary orders reviewed?: Yes Do you have support at home?: Yes People in Home: spouse  Medications Reviewed Today: Medications Reviewed Today     Reviewed by Karena Addison, LPN (Licensed Practical Nurse) on 07/01/23 at [phone]  Med List Status: <None>   Medication Order Taking? Sig Documenting Provider Last Dose Status Informant  acetaminophen (TYLENOL) 325 MG tablet [id number]  Take 2 tablets (650 mg total) by mouth every 6 (six) hours as needed for up to 5 days for mild pain (pain score 1-3), headache or fever. Almon Hercules, MD  Active   Multiple Vitamins-Minerals (MULTIVITAMIN WITH MINERALS) tablet [id number] No Take 1 tablet by mouth daily. [provider] Past Week Active Self, Pharmacy Records  valsartan (DIOVAN) 80 MG tablet [id number] No Take 1 tablet (80 mg total) by mouth daily. Donita Brooks, MD Past Week Active Self, Pharmacy Records            Home Care and Equipment/Supplies: Were Home Health Services Ordered?: NA Any new equipment or medical supplies ordered?: NA  Functional Questionnaire: Do you need assistance with bathing/showering or dressing?: No Do you need assistance with meal preparation?:  No Do you need assistance with eating?: No Do you have difficulty maintaining continence: No Do you need assistance with getting out of bed/getting out of a chair/moving?: No Do you have difficulty managing or taking your medications?: No  Follow up appointments reviewed: PCP Follow-up appointment confirmed?: Yes Date of PCP follow-up appointment?: 07/11/23 Follow-up Provider: [facility] Follow-up appointment confirmed?: No Reason Specialist Follow-Up Not Confirmed: Patient has Specialist Provider Number and will Call for Appointment Do you need transportation to your follow-up appointment?: No Do you understand care options if your condition(s) worsen?: Yes-patient verbalized understanding    SIGNATURE Karena Addison, LPN [facility] Nurse Health Advisor Direct Dial [phone]

## 2023-07-11 ENCOUNTER — Ambulatory Visit: Admitting: Family Medicine

## 2023-07-11 ENCOUNTER — Encounter: Payer: Self-pay | Admitting: Family Medicine

## 2023-07-11 VITALS — BP 126/76 | HR 80 | Temp 98.0°F | Ht 66.0 in | Wt 235.2 lb

## 2023-07-11 DIAGNOSIS — R7989 Other specified abnormal findings of blood chemistry: Secondary | ICD-10-CM

## 2023-07-11 NOTE — Progress Notes (Signed)
 Subjective:    Patient ID: Tracy Mcgee, female    DOB: 08-Mar-1966, 58 y.o.   MRN: [id number]  HPI Patient was recently admitted with abdominal pain and elevated liver function test.  MRCP revealed choledocholithiasis and cholecystitis.  Patient underwent ERCP and had several gallstones removed from the biliary tree.  She then underwent cholecystectomy.  This occurred on March 21.  She is here today for follow-up.  Patient has done remarkably well since surgery.  She denies any severe abdominal pain she denies any nausea or vomiting.  She is yet to experience any severe diarrhea.  She denies any bleeding or bruising.  Laparoscopic several sites appear to be healed with no evidence of any secondary cellulitis or subcutaneous abscess formation.  Patient denies any pain numbness of both.  She denies any symptoms of pneumonia.  He denies any dysuria urgency or frequency.  There is no swelling in her legs to suggest a blood clot.  Patient has several laparoscopic appear Past Medical History:  Diagnosis Date   Arthritis    CKD (chronic kidney disease) stage 3, GFR 30-59 ml/min (HCC)    Hypertension    Past Surgical History:  Procedure Laterality Date   CHOLECYSTECTOMY N/A 06/28/2023   Procedure: LAPAROSCOPIC CHOLECYSTECTOMY w/ICG;  Surgeon: Kinsinger, De Blanch, MD;  [facility]: [facility] OR;  Service: General;  Laterality: N/A;  WITH ICG DYE   ERCP N/A 06/27/2023   Procedure: ERCP, WITH INTERVENTION IF INDICATED;  Surgeon: Iva Boop, MD;  Location: [facility] ENDOSCOPY;  Service: Gastroenterology;  Laterality: N/A;   SPHINCTEROTOMY  06/27/2023   Procedure: SPHINCTEROTOMY;  Surgeon: Iva Boop, MD;  Location: [facility] ENDOSCOPY;  Service: Gastroenterology;;   STONE EXTRACTION WITH BASKET  06/27/2023   Procedure: ERCP, WITH LITHROTRIPSY OR REMOVAL OF COMMON BILE DUCT CALCULUS;  Surgeon: Iva Boop, MD;  Location: [facility] ENDOSCOPY;  Service: Gastroenterology;;   Current Outpatient Medications on File Prior to  Visit  Medication Sig Dispense Refill   Multiple Vitamins-Minerals (MULTIVITAMIN WITH MINERALS) tablet Take 1 tablet by mouth daily.     valsartan (DIOVAN) 80 MG tablet Take 1 tablet (80 mg total) by mouth daily. 90 tablet 1   No current facility-administered medications on file prior to visit.   No Known Allergies Social History   Socioeconomic History   Marital status: Married    Spouse name: Not on file   Number of children: Not on file   Years of education: Not on file   Highest education level: Associate degree: occupational, Scientist, product/process development, or vocational program  Occupational History   Not on file  Tobacco Use   Smoking status: Never   Smokeless tobacco: Never  Substance and Sexual Activity   Alcohol use: No   Drug use: No   Sexual activity: Yes  Other Topics Concern   Not on file  Social History Narrative   Not on file   Social Drivers of Health   Financial Resource Strain: Low Risk  (04/03/2023)   Overall Financial Resource Strain (CARDIA)    Difficulty of Paying Living Expenses: Not hard at all  Food Insecurity: No Food Insecurity (06/26/2023)   Hunger Vital Sign    Worried About Running Out of Food in the Last Year: Never true    Ran Out of Food in the Last Year: Never true  Transportation Needs: No Transportation Needs (06/26/2023)   PRAPARE - Administrator, Civil Service (Medical): No    Lack of Transportation (Non-Medical): No  Physical  Activity: Insufficiently Active (04/03/2023)   Exercise Vital Sign    Days of Exercise per Week: 1 day    Minutes of Exercise per Session: 10 min  Stress: No Stress Concern Present (04/03/2023)   [employer] of Occupational Health - Occupational Stress Questionnaire    Feeling of Stress : Not at all  Social Connections: Socially Integrated (04/03/2023)   Social Connection and Isolation Panel [NHANES]    Frequency of Communication with Friends and Family: Twice a week    Frequency of Social Gatherings with  Friends and Family: Once a week    Attends Religious Services: More than 4 times per year    Active Member of [employer] or Organizations: Yes    Attends Banker Meetings: More than 4 times per year    Marital Status: Married  Catering manager Violence: Not At Risk (06/26/2023)   Humiliation, Afraid, Rape, and Kick questionnaire    Fear of Current or Ex-Partner: No    Emotionally Abused: No    Physically Abused: No    Sexually Abused: No      Review of Systems  All other systems reviewed and are negative.      Objective:   Physical Exam Vitals reviewed.  Constitutional:      General: She is not in acute distress.    Appearance: She is well-developed. She is not diaphoretic.  HENT:     Head: Normocephalic and atraumatic.     Mouth/Throat:     Mouth: Mucous membranes are moist.     Pharynx: No oropharyngeal exudate or posterior oropharyngeal erythema.  Eyes:     Conjunctiva/sclera: Conjunctivae normal.  Cardiovascular:     Rate and Rhythm: Normal rate and regular rhythm.     Heart sounds: Normal heart sounds. No murmur heard.    No friction rub. No gallop.  Pulmonary:     Effort: Pulmonary effort is normal. No respiratory distress.     Breath sounds: Normal breath sounds. No wheezing or rales.  Abdominal:     General: Bowel sounds are normal.     Palpations: Abdomen is soft.    Neurological:     General: No focal deficit present.     Mental Status: She is oriented to person, place, and time. Mental status is at baseline.     Cranial Nerves: No cranial nerve deficit.     Motor: No weakness.     Coordination: Coordination normal.     Gait: Gait normal.     Deep Tendon Reflexes: Reflexes normal.           Assessment & Plan:  Elevated liver function tests - Plan: CBC with Differential/Platelet, COMPLETE METABOLIC PANEL WITHOUT GFR Patient appears to be recovering well after surgery.  There is no evidence of any postoperative complications today on exam.   She does have some soreness with activity as to be expected.  Recommended no strenuous activity for another 2 weeks to allow healing.  Patient is able to do basic activities around the house.  We will repeat a CBC today and a CMP to ensure resolution of her elevated liver function test.  Clinically she appears well today.

## 2023-07-12 LAB — COMPLETE METABOLIC PANEL WITHOUT GFR
AG Ratio: 1.3 (calc) (ref 1.0–2.5)
ALT: 27 U/L (ref 6–29)
AST: 19 U/L (ref 10–35)
Albumin: 4 g/dL (ref 3.6–5.1)
Alkaline phosphatase (APISO): 61 U/L (ref 37–153)
BUN: 15 mg/dL (ref 7–25)
CO2: 26 mmol/L (ref 20–32)
Calcium: 9.3 mg/dL (ref 8.6–10.4)
Chloride: 103 mmol/L (ref 98–110)
Creat: 0.96 mg/dL (ref 0.50–1.03)
Globulin: 3.1 g/dL (ref 1.9–3.7)
Glucose, Bld: 110 mg/dL — ABNORMAL HIGH (ref 65–99)
Potassium: 4.4 mmol/L (ref 3.5–5.3)
Sodium: 140 mmol/L (ref 135–146)
Total Bilirubin: 0.4 mg/dL (ref 0.2–1.2)
Total Protein: 7.1 g/dL (ref 6.1–8.1)

## 2023-07-12 LAB — CBC WITH DIFFERENTIAL/PLATELET
Absolute Lymphocytes: 2450 {cells}/uL (ref 850–3900)
Absolute Monocytes: 625 {cells}/uL (ref 200–950)
Basophils Absolute: 78 {cells}/uL (ref 0–200)
Basophils Relative: 1.1 %
Eosinophils Absolute: 511 {cells}/uL — ABNORMAL HIGH (ref 15–500)
Eosinophils Relative: 7.2 %
HCT: 40.4 % (ref 35.0–45.0)
Hemoglobin: 13.7 g/dL (ref 11.7–15.5)
MCH: 31.1 pg (ref 27.0–33.0)
MCHC: 33.9 g/dL (ref 32.0–36.0)
MCV: 91.8 fL (ref 80.0–100.0)
MPV: 12.5 fL (ref 7.5–12.5)
Monocytes Relative: 8.8 %
Neutro Abs: 3436 {cells}/uL (ref 1500–7800)
Neutrophils Relative %: 48.4 %
Platelets: 358 10*3/uL (ref 140–400)
RBC: 4.4 10*6/uL (ref 3.80–5.10)
RDW: 12.8 % (ref 11.0–15.0)
Total Lymphocyte: 34.5 %
WBC: 7.1 10*3/uL (ref 3.8–10.8)

## 2023-09-09 ENCOUNTER — Other Ambulatory Visit: Payer: Self-pay | Admitting: Family Medicine

## 2023-10-01 ENCOUNTER — Telehealth: Payer: Self-pay

## 2023-10-01 NOTE — Telephone Encounter (Signed)
 VOB submitted for Euflexxa, left knee.

## 2023-10-09 DIAGNOSIS — M1712 Unilateral primary osteoarthritis, left knee: Secondary | ICD-10-CM | POA: Diagnosis not present

## 2023-11-04 ENCOUNTER — Ambulatory Visit: Admitting: Family Medicine

## 2023-11-04 VITALS — BP 132/72 | HR 76 | Temp 98.5°F | Ht 66.0 in | Wt 235.0 lb

## 2023-11-04 DIAGNOSIS — R109 Unspecified abdominal pain: Secondary | ICD-10-CM | POA: Diagnosis not present

## 2023-11-04 LAB — URINALYSIS, ROUTINE W REFLEX MICROSCOPIC
Bilirubin Urine: NEGATIVE
Glucose, UA: NEGATIVE
Hyaline Cast: NONE SEEN /LPF
Ketones, ur: NEGATIVE
Nitrite: NEGATIVE
Protein, ur: NEGATIVE
Specific Gravity, Urine: 1.015 (ref 1.001–1.035)
pH: 6 (ref 5.0–8.0)

## 2023-11-04 LAB — MICROSCOPIC MESSAGE

## 2023-11-04 MED ORDER — TAMSULOSIN HCL 0.4 MG PO CAPS: 0.4000 mg | ORAL_CAPSULE | Freq: Every day | ORAL | 0 refills | Status: DC

## 2023-11-04 NOTE — Progress Notes (Signed)
 Subjective:    Patient ID: Tracy Mcgee, female    DOB: Sep 28, 1965, 58 y.o.   MRN: [id number]  Denied  Flank Pain   Patient was recently admitted with abdominal pain and elevated liver function test.  MRCP revealed choledocholithiasis and cholecystitis.  Patient underwent ERCP and had several gallstones removed from the biliary tree.  She then underwent cholecystectomy.  This occurred on March 21.  Patient has had right-sided abdominal pain since Thursday of last week.  The pain is located more in the right flank/right mid axillary line.  It is below her ribs.  It is constant.  It hurts to take a deep breath in.  However otherwise she denies any nausea or vomiting.  She denies any dysuria urgency or frequency.  She denies any melena or hematochezia or constipation.  She denies any diarrhea.  She denies any fevers or chills.  The pain has been constant for the last 4 days.  It is mild to moderate in intensity.  On exam today, her abdomen is soft nontender nondistended with normal bowel sounds.  There is no guarding or rebound.  Urinalysis shows trace blood +1 leukocyte esterase.  CT scan in March did show a 3 mm right-sided kidney stone in the renal pole.  I question if this may be starting to pass. Past Medical History:  Diagnosis Date   Arthritis    CKD (chronic kidney disease) stage 3, GFR 30-59 ml/min (HCC)    Hypertension    Past Surgical History:  Procedure Laterality Date   CHOLECYSTECTOMY N/A 06/28/2023   Procedure: LAPAROSCOPIC CHOLECYSTECTOMY w/ICG;  Surgeon: Kinsinger, Herlene Righter, MD;  Location[facility] OR;  Service: General;  Laterality: N/A;  WITH ICG DYE   ERCP N/A 06/27/2023   Procedure: ERCP, WITH INTERVENTION IF INDICATED;  Surgeon: Avram Lupita BRAVO, MD;  Location: [facility] ENDOSCOPY;  Service: Gastroenterology;  Laterality: N/A;   SPHINCTEROTOMY  06/27/2023   Procedure: SPHINCTEROTOMY;  Surgeon: Avram Lupita BRAVO, MD;  Location: [facility] ENDOSCOPY;  Service: Gastroenterology;;   STONE  EXTRACTION WITH BASKET  06/27/2023   Procedure: ERCP, WITH LITHROTRIPSY OR REMOVAL OF COMMON BILE DUCT CALCULUS;  Surgeon: Avram Lupita BRAVO, MD;  Location: [facility] ENDOSCOPY;  Service: Gastroenterology;;   Current Outpatient Medications on File Prior to Visit  Medication Sig Dispense Refill   Multiple Vitamins-Minerals (MULTIVITAMIN WITH MINERALS) tablet Take 1 tablet by mouth daily.     valsartan  (DIOVAN ) 80 MG tablet TAKE 1 TABLET BY MOUTH DAILY 90 tablet 3   No current facility-administered medications on file prior to visit.   No Known Allergies Social History   Socioeconomic History   Marital status: Married    Spouse name: Not on file   Number of children: Not on file   Years of education: Not on file   Highest education level: Associate degree: occupational, Scientist, product/process development, or vocational program  Occupational History   Not on file  Tobacco Use   Smoking status: Never   Smokeless tobacco: Never  Substance and Sexual Activity   Alcohol use: No   Drug use: No   Sexual activity: Yes  Other Topics Concern   Not on file  Social History Narrative   Not on file   Social Drivers of Health   Financial Resource Strain: Low Risk  (11/04/2023)   Overall Financial Resource Strain (CARDIA)    Difficulty of Paying Living Expenses: Not very hard  Food Insecurity: No Food Insecurity (11/04/2023)   Hunger Vital Sign    Worried About  Running Out of Food in the Last Year: Never true    Ran Out of Food in the Last Year: Never true  Transportation Needs: No Transportation Needs (11/04/2023)   PRAPARE - Administrator, Civil Service (Medical): No    Lack of Transportation (Non-Medical): No  Physical Activity: Insufficiently Active (11/04/2023)   Exercise Vital Sign    Days of Exercise per Week: 1 day    Minutes of Exercise per Session: 20 min  Stress: No Stress Concern Present (11/04/2023)   [employer] of Occupational Health - Occupational Stress Questionnaire    Feeling of  Stress: Not at all  Social Connections: Socially Integrated (11/04/2023)   Social Connection and Isolation Panel    Frequency of Communication with Friends and Family: More than three times a week    Frequency of Social Gatherings with Friends and Family: Once a week    Attends Religious Services: More than 4 times per year    Active Member of [employer] or Organizations: Yes    Attends Banker Meetings: More than 4 times per year    Marital Status: Married  Catering manager Violence: Not At Risk (06/26/2023)   Humiliation, Afraid, Rape, and Kick questionnaire    Fear of Current or Ex-Partner: No    Emotionally Abused: No    Physically Abused: No    Sexually Abused: No      Review of Systems  Genitourinary:  Positive for flank pain.  All other systems reviewed and are negative.      Objective:   Physical Exam Vitals reviewed.  Constitutional:      General: She is not in acute distress.    Appearance: She is well-developed. She is not diaphoretic.  HENT:     Head: Normocephalic and atraumatic.     Mouth/Throat:     Mouth: Mucous membranes are moist.     Pharynx: No oropharyngeal exudate or posterior oropharyngeal erythema.  Eyes:     Conjunctiva/sclera: Conjunctivae normal.  Cardiovascular:     Rate and Rhythm: Normal rate and regular rhythm.     Heart sounds: Normal heart sounds. No murmur heard.    No friction rub. No gallop.  Pulmonary:     Effort: Pulmonary effort is normal. No respiratory distress.     Breath sounds: Normal breath sounds. No stridor. No wheezing, rhonchi or rales.  Abdominal:     General: Abdomen is flat. Bowel sounds are normal. There is no distension.     Palpations: Abdomen is soft. There is no mass.     Tenderness: There is no abdominal tenderness. There is no guarding or rebound.   Neurological:     General: No focal deficit present.     Mental Status: She is oriented to person, place, and time. Mental status is at baseline.      Cranial Nerves: No cranial nerve deficit.     Motor: No weakness.     Coordination: Coordination normal.     Gait: Gait normal.     Deep Tendon Reflexes: Reflexes normal.           Assessment & Plan:  Right flank pain - Plan: Urinalysis, Routine w reflex microscopic, CBC with Differential/Platelet, Comprehensive metabolic panel with GFR Check CBC and CMP.  I suspect the patient's pain could be a kidney stone trying to pass versus scar tissue from surgery versus an undiagnosed problem.  If there is elevated liver function test or an elevated white blood cell count, I  feel that we need to get imaging of the abdomen and pelvis to evaluate for any disease in the biliary tree.  Pain is too high to be appendicitis.  There is no evidence of an acute abdomen.  Treat the patient empirically with Flomax  0.4 mg daily to facilitate kidney stone passage until we are sure what we are dealing with.  Patient was explained the situation and knows when to seek additional medical test.  If labs are normal, we may try Flomax  and pain medication for a few days to see if the pain will subside.  If the patient develops any emergency symptoms such as an acute abdomen or fever or chills she will seek medical attention immediately

## 2023-11-05 ENCOUNTER — Ambulatory Visit: Payer: Self-pay | Admitting: Family Medicine

## 2023-11-05 ENCOUNTER — Other Ambulatory Visit: Payer: Self-pay | Admitting: Family Medicine

## 2023-11-05 LAB — COMPREHENSIVE METABOLIC PANEL WITH GFR
AG Ratio: 1.4 (calc) (ref 1.0–2.5)
ALT: 14 U/L (ref 6–29)
AST: 16 U/L (ref 10–35)
Albumin: 4.2 g/dL (ref 3.6–5.1)
Alkaline phosphatase (APISO): 52 U/L (ref 37–153)
BUN: 20 mg/dL (ref 7–25)
CO2: 25 mmol/L (ref 20–32)
Calcium: 9.3 mg/dL (ref 8.6–10.4)
Chloride: 103 mmol/L (ref 98–110)
Creat: 1.03 mg/dL (ref 0.50–1.03)
Globulin: 3.1 g/dL (ref 1.9–3.7)
Glucose, Bld: 95 mg/dL (ref 65–99)
Potassium: 4.4 mmol/L (ref 3.5–5.3)
Sodium: 139 mmol/L (ref 135–146)
Total Bilirubin: 0.3 mg/dL (ref 0.2–1.2)
Total Protein: 7.3 g/dL (ref 6.1–8.1)
eGFR: 63 mL/min/1.73m2 (ref >=60)

## 2023-11-05 LAB — CBC WITH DIFFERENTIAL/PLATELET
Absolute Lymphocytes: 2457 {cells}/uL (ref 850–3900)
Absolute Monocytes: 772 {cells}/uL (ref 200–950)
Basophils Absolute: 70 {cells}/uL (ref 0–200)
Basophils Relative: 0.9 %
Eosinophils Absolute: 367 {cells}/uL (ref 15–500)
Eosinophils Relative: 4.7 %
HCT: 40.7 % (ref 35.0–45.0)
Hemoglobin: 13.4 g/dL (ref 11.7–15.5)
MCH: 30.2 pg (ref 27.0–33.0)
MCHC: 32.9 g/dL (ref 32.0–36.0)
MCV: 91.7 fL (ref 80.0–100.0)
MPV: 11.4 fL (ref 7.5–12.5)
Monocytes Relative: 9.9 %
Neutro Abs: 4134 {cells}/uL (ref 1500–7800)
Neutrophils Relative %: 53 %
Platelets: 317 Thousand/uL (ref 140–400)
RBC: 4.44 Million/uL (ref 3.80–5.10)
RDW: 13 % (ref 11.0–15.0)
Total Lymphocyte: 31.5 %
WBC: 7.8 Thousand/uL (ref 3.8–10.8)

## 2023-11-05 MED ORDER — HYDROCODONE-ACETAMINOPHEN 5-325 MG PO TABS: 1.0000 | ORAL_TABLET | Freq: Four times a day (QID) | ORAL | 0 refills | Status: DC | PRN

## 2023-11-26 ENCOUNTER — Other Ambulatory Visit: Payer: Self-pay | Admitting: Family Medicine

## 2023-11-26 NOTE — Telephone Encounter (Signed)
 Prescription Request  11/26/2023  LOV: 11/04/2023  What is the name of the medication or equipment?   tamsulosin  (FLOMAX ) 0.4 MG CAPS capsule  **90 day script needed  Have you contacted your pharmacy to request a refill? Yes   Which pharmacy would you like this sent to?  [facility] - [place], [place] - [address] [place] [address] Phone: [phone] Fax: [phone]    Patient notified that their request is being sent to the clinical staff for review and that they should receive a response within 2 business days.   Please advise pharmacist.

## 2023-11-27 ENCOUNTER — Other Ambulatory Visit: Payer: Self-pay | Admitting: Family Medicine

## 2023-11-27 NOTE — Telephone Encounter (Signed)
 Requested medication (s) are due for refill today - unsure  Requested medication (s) are on the active medication list -yes  Future visit scheduled -no  Last refill: 11/04/23 #30  Notes to clinic: Acute visit Rx- review to continue   Requested Prescriptions  Pending Prescriptions Disp Refills   tamsulosin  (FLOMAX ) 0.4 MG CAPS capsule 30 capsule 0    Sig: Take 1 capsule (0.4 mg total) by mouth daily.     Urology: Alpha-Adrenergic Blocker Failed - 11/27/2023  4:27 PM      Failed - PSA in normal range and within 360 days    No results found for: LABPSA, PSA, PSA1, ULTRAPSA       Passed - Last BP in normal range    BP Readings from Last 1 Encounters:  11/04/23 132/72         Passed - Valid encounter within last 12 months    Recent Outpatient Visits           3 weeks ago Right flank pain   [place] [facility] Duanne Butler DASEN, MD   4 months ago Elevated liver function tests   [place] [facility] Duanne Butler DASEN, MD   7 months ago Viral upper respiratory tract infection   [place] [facility] Duanne Butler DASEN, MD   1 year ago Acute lateral meniscal tear with peripheral detachment, left, initial encounter   [place] [facility] Duanne Butler DASEN, MD   1 year ago Acute pain of left knee   [place] [facility] Duanne Butler DASEN, MD                 Requested Prescriptions  Pending Prescriptions Disp Refills   tamsulosin  (FLOMAX ) 0.4 MG CAPS capsule 30 capsule 0    Sig: Take 1 capsule (0.4 mg total) by mouth daily.     Urology: Alpha-Adrenergic Blocker Failed - 11/27/2023  4:27 PM      Failed - PSA in normal range and within 360 days    No results found for: LABPSA, PSA, PSA1, ULTRAPSA       Passed - Last BP in normal range    BP Readings from Last 1 Encounters:  11/04/23 132/72         Passed - Valid encounter within last 12 months    Recent  Outpatient Visits           3 weeks ago Right flank pain   [place] [facility] Duanne Butler DASEN, MD   4 months ago Elevated liver function tests   [facility] Duanne Butler DASEN, MD   7 months ago Viral upper respiratory tract infection   [place] [facility] Duanne Butler DASEN, MD   1 year ago Acute lateral meniscal tear with peripheral detachment, left, initial encounter   [place] [facility] Duanne Butler DASEN, MD   1 year ago Acute pain of left knee    [place] [facility] Pickard, Butler DASEN, MD

## 2023-11-28 NOTE — Telephone Encounter (Signed)
 [facility] sent original request for refill of  tamsulosin  (FLOMAX ) 0.4 MG CAPS capsule   [facility] pharmacy also sent request for refill of  tamsulosin  (FLOMAX ) 0.4 MG CAPS capsule   Pharmacy info:     Please advise pharmacist.

## 2023-11-28 NOTE — Telephone Encounter (Signed)
 Requested medications are due for refill today.  Unsure  Requested medications are on the active medications list.  yes  Last refill. 11/04/2023 #30 0 rf  Future visit scheduled.   no  Notes to clinic.  Medication was prescribed for possible kidney stone. Please review for refill.    Requested Prescriptions  Pending Prescriptions Disp Refills   tamsulosin  (FLOMAX ) 0.4 MG CAPS capsule [Pharmacy Med Name: TAMSULOSIN  HCL 0.4 MG CAPSULE] 90 capsule 1    Sig: TAKE 1 CAPSULE BY MOUTH EVERY DAY     Urology: Alpha-Adrenergic Blocker Failed - 11/28/2023  3:01 PM      Failed - PSA in normal range and within 360 days    No results found for: LABPSA, PSA, PSA1, ULTRAPSA       Passed - Last BP in normal range    BP Readings from Last 1 Encounters:  11/04/23 132/72         Passed - Valid encounter within last 12 months    Recent Outpatient Visits           3 weeks ago Right flank pain   [place] [facility] Duanne Butler DASEN, MD   4 months ago Elevated liver function tests   [place] [facility] Duanne Butler DASEN, MD   7 months ago Viral upper respiratory tract infection   [place] [facility] Duanne Butler DASEN, MD   1 year ago Acute lateral meniscal tear with peripheral detachment, left, initial encounter   [place] [facility] Duanne Butler DASEN, MD   1 year ago Acute pain of left knee   [place] [facility] Pickard, Butler DASEN, MD

## 2023-12-12 ENCOUNTER — Other Ambulatory Visit: Payer: Self-pay | Admitting: Family Medicine

## 2023-12-12 NOTE — Telephone Encounter (Signed)
 Prescription Request  12/12/2023  LOV: 11/04/2023  What is the name of the medication or equipment? tamsulosin  (FLOMAX ) 0.4 MG CAPS capsule   Have you contacted your pharmacy to request a refill? Yes   Which pharmacy would you like this sent to?  [facility] - [place], [place] - [address] [place] [address] Phone: [phone] Fax: [phone]    Patient notified that their request is being sent to the clinical staff for review and that they should receive a response within 2 business days.   Please advise at [facility] [phone]

## 2023-12-12 NOTE — Telephone Encounter (Signed)
 Requested medications are due for refill today.  A little too soon - pt is requesting a 90 day supply  Requested medications are on the active medications list.  yes  Last refill. 12/03/2023 #30 0 rf  Future visit scheduled.   no  Notes to clinic.  Missing labs.    Requested Prescriptions  Pending Prescriptions Disp Refills   tamsulosin  (FLOMAX ) 0.4 MG CAPS capsule 90 capsule 1    Sig: Take 1 capsule (0.4 mg total) by mouth daily.     Urology: Alpha-Adrenergic Blocker Failed - 12/12/2023  5:43 PM      Failed - PSA in normal range and within 360 days    No results found for: LABPSA, PSA, PSA1, ULTRAPSA       Passed - Last BP in normal range    BP Readings from Last 1 Encounters:  11/04/23 132/72         Passed - Valid encounter within last 12 months    Recent Outpatient Visits           1 month ago Right flank pain   [place] [facility] Duanne Butler DASEN, MD   5 months ago Elevated liver function tests   [place] [facility] Duanne Butler DASEN, MD   8 months ago Viral upper respiratory tract infection   [place] [facility] Duanne Butler DASEN, MD   1 year ago Acute lateral meniscal tear with peripheral detachment, left, initial encounter   [place] [facility] Duanne Butler DASEN, MD   1 year ago Acute pain of left knee   [place] [facility] Pickard, Butler DASEN, MD

## 2023-12-20 ENCOUNTER — Encounter: Payer: Self-pay | Admitting: Family Medicine

## 2023-12-20 ENCOUNTER — Ambulatory Visit: Admitting: Family Medicine

## 2023-12-20 VITALS — BP 120/74 | HR 90 | Temp 98.3°F | Ht 66.0 in | Wt 235.0 lb

## 2023-12-20 DIAGNOSIS — R42 Dizziness and giddiness: Secondary | ICD-10-CM | POA: Diagnosis not present

## 2023-12-20 NOTE — Progress Notes (Signed)
 Subjective:    Patient ID: Tracy Mcgee, female    DOB: 05-25-1965, 58 y.o.   MRN: [id number]  Patient presents today complaining of dizziness.  She states that she feels dizzy with standing over the last 2 weeks.  The room does not spin.  She has a negative Dix-Hallpike maneuver bilaterally today.  She denies any headache or sinus pain or otalgia.  She denies any palpitations or chest pain.  She denies any pleurisy or shortness of breath.  She quit taking Flomax  and also quit taking valsartan  approximately 1 week ago.  The dizziness is better.  Having stopped both blood pressure medications her blood pressure still remains relatively low for her at 120/74.  Romberg test today is normal.  Heel-to-toe walk test today is normal.  There is no gross neurologic deficit on physical exam. Past Medical History:  Diagnosis Date   Arthritis    CKD (chronic kidney disease) stage 3, GFR 30-59 ml/min (HCC)    Hypertension    Past Surgical History:  Procedure Laterality Date   CHOLECYSTECTOMY N/A 06/28/2023   Procedure: LAPAROSCOPIC CHOLECYSTECTOMY w/ICG;  Surgeon: Kinsinger, Herlene Righter, MD;  Location: [facility] OR;  Service: General;  Laterality: N/A;  WITH ICG DYE   ERCP N/A 06/27/2023   Procedure: ERCP, WITH INTERVENTION IF INDICATED;  Surgeon: Avram Lupita BRAVO, MD;  Location: [facility] ENDOSCOPY;  Service: Gastroenterology;  Laterality: N/A;   SPHINCTEROTOMY  06/27/2023   Procedure: SPHINCTEROTOMY;  Surgeon: Avram Lupita BRAVO, MD;  Location: [facility] ENDOSCOPY;  Service: Gastroenterology;;   STONE EXTRACTION WITH BASKET  06/27/2023   Procedure: ERCP, WITH LITHROTRIPSY OR REMOVAL OF COMMON BILE DUCT CALCULUS;  Surgeon: Avram Lupita BRAVO, MD;  Location: [facility] ENDOSCOPY;  Service: Gastroenterology;;   Current Outpatient Medications on File Prior to Visit  Medication Sig Dispense Refill   HYDROcodone -acetaminophen  (NORCO/VICODIN) 5-325 MG tablet Take 1 tablet by mouth every 6 (six) hours as needed for moderate pain (pain score 4-6).  30 tablet 0   Multiple Vitamins-Minerals (MULTIVITAMIN WITH MINERALS) tablet Take 1 tablet by mouth daily.     tamsulosin  (FLOMAX ) 0.4 MG CAPS capsule TAKE 1 CAPSULE BY MOUTH EVERY DAY 90 capsule 1   valsartan  (DIOVAN ) 80 MG tablet TAKE 1 TABLET BY MOUTH DAILY 90 tablet 3   No current facility-administered medications on file prior to visit.   No Known Allergies Social History   Socioeconomic History   Marital status: Married    Spouse name: Not on file   Number of children: Not on file   Years of education: Not on file   Highest education level: Associate degree: occupational, Scientist, product/process development, or vocational program  Occupational History   Not on file  Tobacco Use   Smoking status: Never   Smokeless tobacco: Never  Substance and Sexual Activity   Alcohol use: No   Drug use: No   Sexual activity: Yes  Other Topics Concern   Not on file  Social History Narrative   Not on file   Social Drivers of Health   Financial Resource Strain: Low Risk  (11/04/2023)   Overall Financial Resource Strain (CARDIA)    Difficulty of Paying Living Expenses: Not very hard  Food Insecurity: No Food Insecurity (11/04/2023)   Hunger Vital Sign    Worried About Running Out of Food in the Last Year: Never true    Ran Out of Food in the Last Year: Never true  Transportation Needs: No Transportation Needs (11/04/2023)   PRAPARE - Transportation  Lack of Transportation (Medical): No    Lack of Transportation (Non-Medical): No  Physical Activity: Insufficiently Active (11/04/2023)   Exercise Vital Sign    Days of Exercise per Week: 1 day    Minutes of Exercise per Session: 20 min  Stress: No Stress Concern Present (11/04/2023)   [employer] of Occupational Health - Occupational Stress Questionnaire    Feeling of Stress: Not at all  Social Connections: Socially Integrated (11/04/2023)   Social Connection and Isolation Panel    Frequency of Communication with Friends and Family: More than three times a  week    Frequency of Social Gatherings with Friends and Family: Once a week    Attends Religious Services: More than 4 times per year    Active Member of [employer] or Organizations: Yes    Attends Banker Meetings: More than 4 times per year    Marital Status: Married  Catering manager Violence: Not At Risk (06/26/2023)   Humiliation, Afraid, Rape, and Kick questionnaire    Fear of Current or Ex-Partner: No    Emotionally Abused: No    Physically Abused: No    Sexually Abused: No      Review of Systems  All other systems reviewed and are negative.      Objective:   Physical Exam Vitals reviewed.  Constitutional:      General: She is not in acute distress.    Appearance: She is well-developed. She is not diaphoretic.  HENT:     Head: Normocephalic and atraumatic.     Right Ear: Tympanic membrane and ear canal normal.     Left Ear: Tympanic membrane and ear canal normal.     Mouth/Throat:     Mouth: Mucous membranes are moist.     Pharynx: No oropharyngeal exudate or posterior oropharyngeal erythema.  Eyes:     Extraocular Movements: Extraocular movements intact.     Conjunctiva/sclera: Conjunctivae normal.     Pupils: Pupils are equal, round, and reactive to light.  Cardiovascular:     Rate and Rhythm: Normal rate and regular rhythm.     Heart sounds: Normal heart sounds. No murmur heard.    No friction rub. No gallop.  Pulmonary:     Effort: Pulmonary effort is normal. No respiratory distress.     Breath sounds: Normal breath sounds. No stridor. No wheezing, rhonchi or rales.  Abdominal:     General: Abdomen is flat. Bowel sounds are normal. There is no distension.     Palpations: Abdomen is soft. There is no mass.     Tenderness: There is no abdominal tenderness. There is no guarding or rebound.  Neurological:     General: No focal deficit present.     Mental Status: She is oriented to person, place, and time. Mental status is at baseline.     Cranial  Nerves: No cranial nerve deficit.     Motor: No weakness.     Coordination: Coordination normal.     Gait: Gait normal.     Deep Tendon Reflexes: Reflexes normal.           Assessment & Plan:  Orthostatic dizziness - Plan: CBC with Differential/Platelet, Comprehensive metabolic panel with GFR, Urinalysis, Routine w reflex microscopic I believe the orthostatic dizziness is secondary to relative hypotension.  I recommended that she drink Gatorade and eat sodium rich foods such as chicken noodle soup.  Over the weekend I will try to raise her blood pressure by eating a sodium rich  diet and see if the orthostatic dizziness improves.  Monitor for symptoms of vertigo such as the room spinning.  We discussed this at length.  She continues to have dizziness next week after her blood pressure improves, I would recommend an MRI of the brain as physically there are no physical exam findings to explain her dizziness

## 2023-12-21 LAB — CBC WITH DIFFERENTIAL/PLATELET
Absolute Lymphocytes: 2380 {cells}/uL (ref 850–3900)
Absolute Monocytes: 847 {cells}/uL (ref 200–950)
Basophils Absolute: 70 {cells}/uL (ref 0–200)
Basophils Relative: 1 %
Eosinophils Absolute: 322 {cells}/uL (ref 15–500)
Eosinophils Relative: 4.6 %
HCT: 39.8 % (ref 35.0–45.0)
Hemoglobin: 13.3 g/dL (ref 11.7–15.5)
MCH: 30 pg (ref 27.0–33.0)
MCHC: 33.4 g/dL (ref 32.0–36.0)
MCV: 89.6 fL (ref 80.0–100.0)
MPV: 12.7 fL — ABNORMAL HIGH (ref 7.5–12.5)
Monocytes Relative: 12.1 %
Neutro Abs: 3381 {cells}/uL (ref 1500–7800)
Neutrophils Relative %: 48.3 %
Platelets: 310 Thousand/uL (ref 140–400)
RBC: 4.44 Million/uL (ref 3.80–5.10)
RDW: 12.6 % (ref 11.0–15.0)
Total Lymphocyte: 34 %
WBC: 7 Thousand/uL (ref 3.8–10.8)

## 2023-12-21 LAB — URINALYSIS, ROUTINE W REFLEX MICROSCOPIC
Bilirubin Urine: NEGATIVE
Glucose, UA: NEGATIVE
Hgb urine dipstick: NEGATIVE
Hyaline Cast: NONE SEEN /LPF
Ketones, ur: NEGATIVE
Nitrite: NEGATIVE
Specific Gravity, Urine: 1.022 (ref 1.001–1.035)
pH: 8 (ref 5.0–8.0)

## 2023-12-21 LAB — COMPREHENSIVE METABOLIC PANEL WITH GFR
AG Ratio: 1.2 (calc) (ref 1.0–2.5)
ALT: 16 U/L (ref 6–29)
AST: 13 U/L (ref 10–35)
Albumin: 4.2 g/dL (ref 3.6–5.1)
Alkaline phosphatase (APISO): 48 U/L (ref 37–153)
BUN: 15 mg/dL (ref 7–25)
CO2: 31 mmol/L (ref 20–32)
Calcium: 9.5 mg/dL (ref 8.6–10.4)
Chloride: 101 mmol/L (ref 98–110)
Creat: 0.94 mg/dL (ref 0.50–1.03)
Globulin: 3.4 g/dL (ref 1.9–3.7)
Glucose, Bld: 110 mg/dL — ABNORMAL HIGH (ref 65–99)
Potassium: 4.2 mmol/L (ref 3.5–5.3)
Sodium: 139 mmol/L (ref 135–146)
Total Bilirubin: 0.4 mg/dL (ref 0.2–1.2)
Total Protein: 7.6 g/dL (ref 6.1–8.1)
eGFR: 70 mL/min/1.73m2 (ref >=60)

## 2023-12-21 LAB — MICROSCOPIC MESSAGE

## 2023-12-23 ENCOUNTER — Encounter: Payer: Self-pay | Admitting: Family Medicine

## 2023-12-23 ENCOUNTER — Ambulatory Visit: Payer: Self-pay | Admitting: Family Medicine

## 2024-02-10 ENCOUNTER — Encounter: Payer: Self-pay | Admitting: Radiology

## 2024-02-28 IMAGING — DX DG LUMBAR SPINE COMPLETE 4+V
5 series · 5 of 5 positions shown · non-contrast
Comparison: No prior.

CLINICAL DATA: Chronic low back pain.  No recent injury.

EXAM:
LUMBAR SPINE - COMPLETE 4+ VIEW

[l-spine ap]
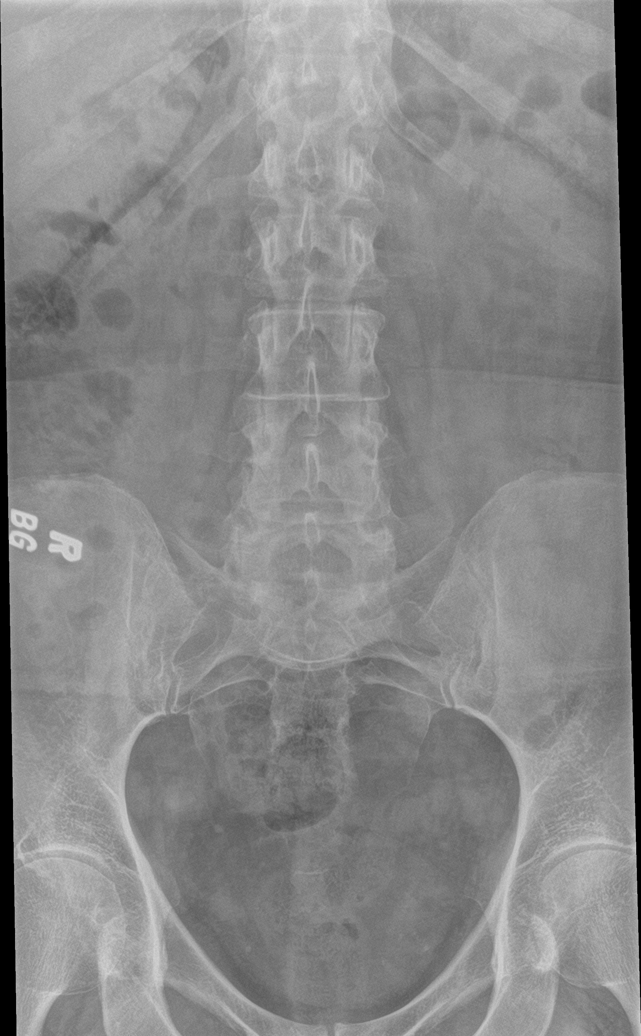

[l-spine obl (1 of 2)]
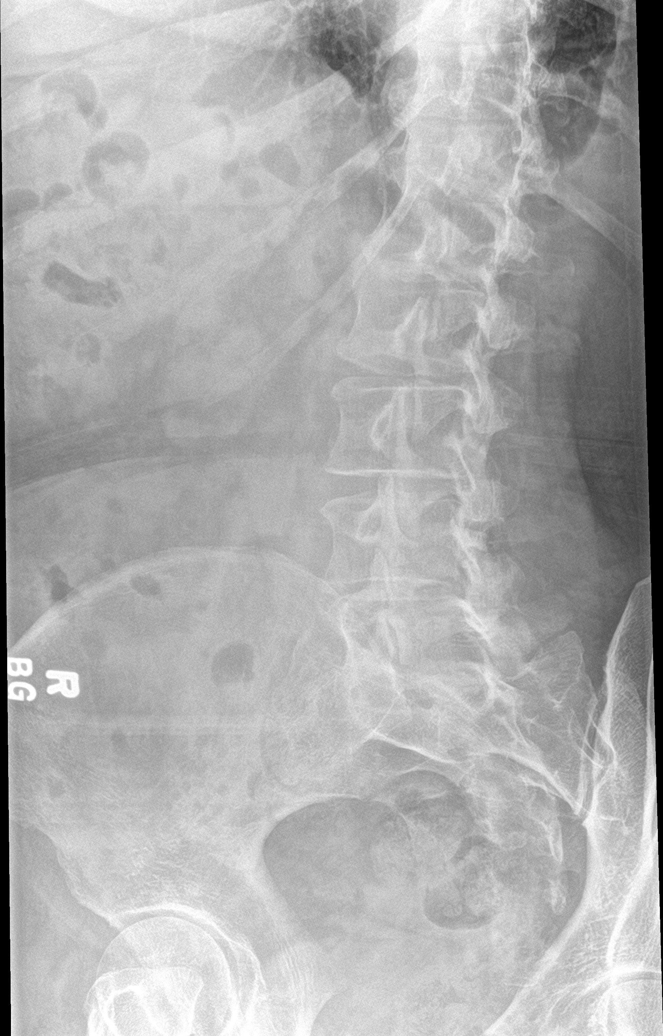

[l-spine obl (2 of 2)]
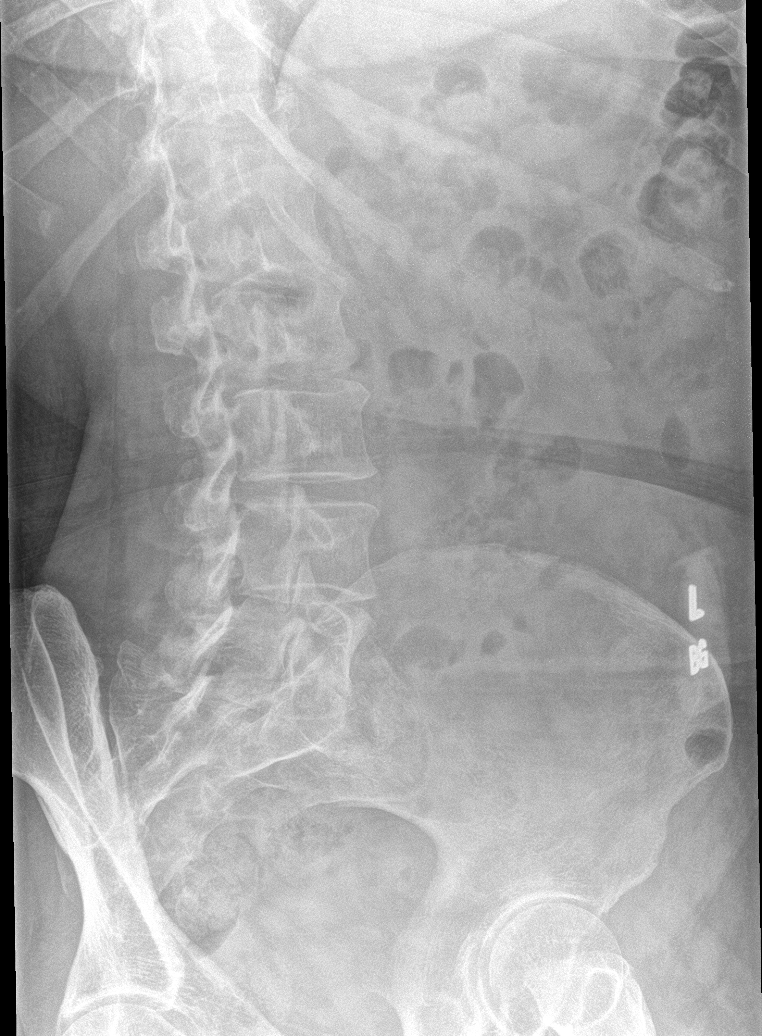

[l-spine lat]
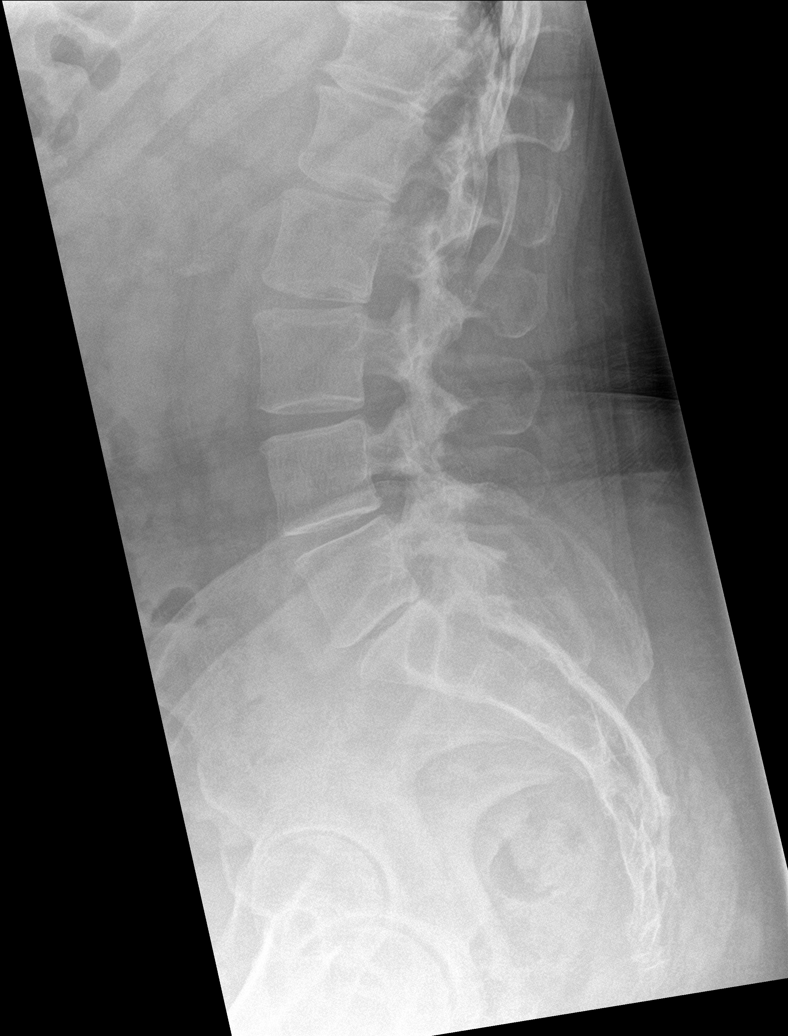

[l-spine spot]
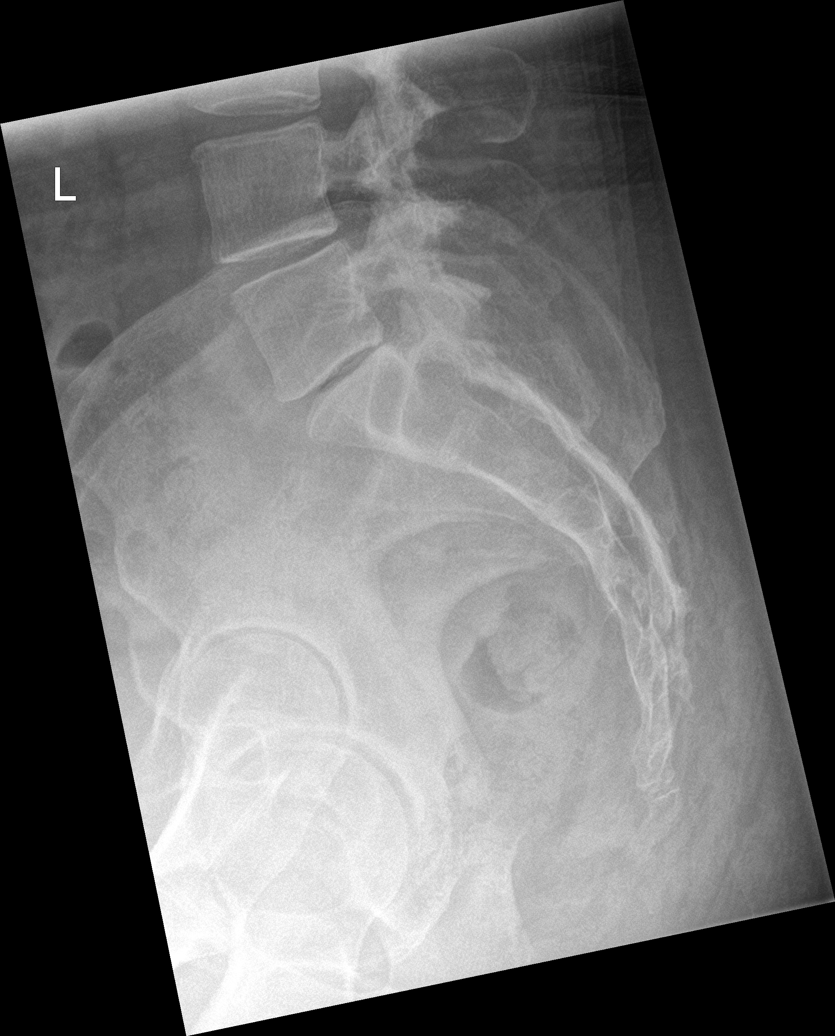

[5 of 5 positions shown; findings below may reference images not displayed]

FINDINGS: Lumbar spine numbered the lowest segmented appearing lumbar shaped
vertebrae on lateral view as L5. Paraspinal soft tissues are
unremarkable. Pelvic calcifications consistent phleboliths. Stool
noted throughout the colon. Diffuse multilevel disc degeneration,
endplate osteophyte formation, and facet hypertrophy. Disc
degeneration most prominent at L5-S1. No acute bony abnormality. No
evidence of fracture.
IMPRESSION: Diffuse degenerative change. Disc degeneration most prominent at
L5-S1. No acute bony abnormality.

## 2024-04-29 ENCOUNTER — Telehealth: Payer: Self-pay

## 2024-04-29 NOTE — Telephone Encounter (Signed)
 Copied from CRM #[id number]. Topic: General - Other >> Apr 28, 2024  9:08 AM Joesph NOVAK wrote: Reason for CRM: [facility] sent a fax over to Dr.Pickard. they will be resending it. Please advise pt if received.

## 2024-06-16 ENCOUNTER — Ambulatory Visit (HOSPITAL_COMMUNITY): Admit: 2024-06-16 | Admitting: Orthopedic Surgery
